# Patient Record
Sex: Female | Born: 1965 | Race: Black or African American | Hispanic: No | Marital: Married | Smoking: Never smoker
Health system: Southern US, Community
[De-identification: ages and names within clinical notes are randomized; demographics above are authoritative.]

## PROBLEM LIST (undated history)

## (undated) DIAGNOSIS — K219 Gastro-esophageal reflux disease without esophagitis: Secondary | ICD-10-CM

## (undated) DIAGNOSIS — H04123 Dry eye syndrome of bilateral lacrimal glands: Secondary | ICD-10-CM

## (undated) DIAGNOSIS — D649 Anemia, unspecified: Secondary | ICD-10-CM

## (undated) DIAGNOSIS — E785 Hyperlipidemia, unspecified: Secondary | ICD-10-CM

## (undated) DIAGNOSIS — M1990 Unspecified osteoarthritis, unspecified site: Secondary | ICD-10-CM

## (undated) DIAGNOSIS — D259 Leiomyoma of uterus, unspecified: Secondary | ICD-10-CM

## (undated) HISTORY — PX: UPPER GI ENDOSCOPY: SHX6162

## (undated) HISTORY — PX: COLONOSCOPY: SHX174

## (undated) HISTORY — DX: Dry eye syndrome of bilateral lacrimal glands: H04.123

## (undated) HISTORY — DX: Hyperlipidemia, unspecified: E78.5

## (undated) HISTORY — DX: Leiomyoma of uterus, unspecified: D25.9

## (undated) HISTORY — PX: OTHER SURGICAL HISTORY: SHX169

---

## 2000-02-15 ENCOUNTER — Other Ambulatory Visit: Admission: RE | Admit: 2000-02-15 | Discharge: 2000-02-15 | Payer: Self-pay | Admitting: Obstetrics

## 2000-04-07 ENCOUNTER — Ambulatory Visit (HOSPITAL_COMMUNITY): Admission: RE | Admit: 2000-04-07 | Discharge: 2000-04-07 | Payer: Self-pay | Admitting: Nephrology

## 2000-04-07 ENCOUNTER — Encounter: Payer: Self-pay | Admitting: Nephrology

## 2000-04-14 ENCOUNTER — Encounter: Payer: Self-pay | Admitting: Nephrology

## 2000-04-14 ENCOUNTER — Ambulatory Visit (HOSPITAL_COMMUNITY): Admission: RE | Admit: 2000-04-14 | Discharge: 2000-04-14 | Payer: Self-pay | Admitting: Nephrology

## 2000-07-07 ENCOUNTER — Encounter: Admission: RE | Admit: 2000-07-07 | Discharge: 2000-07-07 | Payer: Self-pay | Admitting: Nephrology

## 2000-07-07 ENCOUNTER — Encounter: Payer: Self-pay | Admitting: Nephrology

## 2002-02-22 ENCOUNTER — Emergency Department (HOSPITAL_COMMUNITY): Admission: EM | Admit: 2002-02-22 | Discharge: 2002-02-22 | Payer: Self-pay | Admitting: Emergency Medicine

## 2002-02-22 ENCOUNTER — Encounter: Payer: Self-pay | Admitting: Emergency Medicine

## 2002-06-14 ENCOUNTER — Encounter: Payer: Self-pay | Admitting: Obstetrics

## 2002-06-14 ENCOUNTER — Ambulatory Visit (HOSPITAL_COMMUNITY): Admission: RE | Admit: 2002-06-14 | Discharge: 2002-06-14 | Payer: Self-pay | Admitting: Obstetrics

## 2004-02-17 ENCOUNTER — Emergency Department (HOSPITAL_COMMUNITY): Admission: EM | Admit: 2004-02-17 | Discharge: 2004-02-18 | Payer: Self-pay | Admitting: Emergency Medicine

## 2004-09-03 ENCOUNTER — Ambulatory Visit (HOSPITAL_COMMUNITY): Admission: RE | Admit: 2004-09-03 | Discharge: 2004-09-03 | Payer: Self-pay | Admitting: Obstetrics

## 2006-02-02 ENCOUNTER — Ambulatory Visit (HOSPITAL_COMMUNITY): Admission: RE | Admit: 2006-02-02 | Discharge: 2006-02-02 | Payer: Self-pay | Admitting: Obstetrics

## 2007-06-19 ENCOUNTER — Ambulatory Visit (HOSPITAL_COMMUNITY): Admission: RE | Admit: 2007-06-19 | Discharge: 2007-06-19 | Payer: Self-pay | Admitting: Obstetrics

## 2009-03-18 ENCOUNTER — Ambulatory Visit (HOSPITAL_COMMUNITY): Admission: RE | Admit: 2009-03-18 | Discharge: 2009-03-18 | Payer: Self-pay | Admitting: Obstetrics

## 2009-04-28 ENCOUNTER — Encounter: Admission: RE | Admit: 2009-04-28 | Discharge: 2009-04-28 | Payer: Self-pay | Admitting: Obstetrics

## 2010-04-30 ENCOUNTER — Ambulatory Visit (HOSPITAL_COMMUNITY)
Admission: RE | Admit: 2010-04-30 | Discharge: 2010-04-30 | Payer: Self-pay | Source: Home / Self Care | Attending: Obstetrics | Admitting: Obstetrics

## 2010-05-24 ENCOUNTER — Encounter: Payer: Self-pay | Admitting: Obstetrics

## 2010-05-25 ENCOUNTER — Encounter: Payer: Self-pay | Admitting: Obstetrics

## 2011-04-01 ENCOUNTER — Other Ambulatory Visit: Payer: Self-pay | Admitting: Obstetrics

## 2011-04-01 DIAGNOSIS — Z1231 Encounter for screening mammogram for malignant neoplasm of breast: Secondary | ICD-10-CM

## 2011-05-03 ENCOUNTER — Ambulatory Visit
Admission: RE | Admit: 2011-05-03 | Discharge: 2011-05-03 | Disposition: A | Discharge status: Home / Self Care | Payer: 59 | Source: Ambulatory Visit | Attending: Obstetrics | Admitting: Obstetrics

## 2011-05-03 DIAGNOSIS — Z1231 Encounter for screening mammogram for malignant neoplasm of breast: Secondary | ICD-10-CM

## 2011-05-10 ENCOUNTER — Encounter: Payer: Self-pay | Admitting: *Deleted

## 2011-05-10 ENCOUNTER — Emergency Department (INDEPENDENT_AMBULATORY_CARE_PROVIDER_SITE_OTHER)
Admission: EM | Admit: 2011-05-10 | Discharge: 2011-05-10 | Disposition: A | Discharge status: Home / Self Care | Payer: 59 | Source: Home / Self Care

## 2011-05-10 DIAGNOSIS — J069 Acute upper respiratory infection, unspecified: Secondary | ICD-10-CM

## 2011-05-10 MED ORDER — CETIRIZINE-PSEUDOEPHEDRINE ER 5-120 MG PO TB12: 1.0000 | ORAL_TABLET | Freq: Two times a day (BID) | ORAL | Status: AC

## 2011-05-10 NOTE — ED Provider Notes (Signed)
History     CSN: [id number]  Arrival date & time 05/10/11  1109   None     Chief Complaint  Patient presents with  . Cough    (Consider location/radiation/quality/duration/timing/severity/associated sxs/prior treatment) HPI Comments: "My only symptom is nasal congestion and runny nose." Onset of symptoms yesterday. Rhinorrhea is clear. Denies fever, cough or sore throat. Took Claritin this morning and Nyquil last night without improvement.    Past Medical History  Diagnosis Date  . Asthma     Past Surgical History  Procedure Date  . Btl     History reviewed. No pertinent family history.  History  Substance Use Topics  . Smoking status: Never Smoker   . Smokeless tobacco: Not on file  . Alcohol Use: No    OB History    Grav Para Term Preterm Abortions TAB SAB Ect Mult Living                  Review of Systems  Constitutional: Negative for fever, chills and fatigue.  HENT: Positive for congestion and rhinorrhea. Negative for ear pain, sore throat, sneezing, postnasal drip and sinus pressure.   Respiratory: Negative for cough, shortness of breath and wheezing.   Cardiovascular: Negative for chest pain and palpitations.    Allergies  Review of patient's allergies indicates not on file.  Home Medications   Current Outpatient Rx  Name Route Sig Dispense Refill  . CETIRIZINE-PSEUDOEPHEDRINE ER 5-120 MG PO TB12 Oral Take 1 tablet by mouth 2 (two) times daily. 20 tablet 0    BP 126/63  Pulse 70  Temp(Src) 98.4 F (36.9 C) (Oral)  Resp 20  SpO2 100%  Physical Exam  Nursing note and vitals reviewed. Constitutional: She appears well-developed and well-nourished. No distress.  HENT:  Head: Normocephalic and atraumatic.  Right Ear: Tympanic membrane, external ear and ear canal normal.  Left Ear: Tympanic membrane, external ear and ear canal normal.  Nose: Mucosal edema (severe) present.  Mouth/Throat: Uvula is midline, oropharynx is clear and moist and  mucous membranes are normal. No oropharyngeal exudate, posterior oropharyngeal edema or posterior oropharyngeal erythema.  Neck: Neck supple.  Cardiovascular: Normal rate, regular rhythm and normal heart sounds.   Pulmonary/Chest: Effort normal and breath sounds normal. No respiratory distress.  Lymphadenopathy:    She has no cervical adenopathy.  Neurological: She is alert.  Skin: Skin is warm and dry.  Psychiatric: She has a normal mood and affect.    ED Course  Procedures (including critical care time)  Labs Reviewed - No data to display No results found.   1. Acute URI       MDM          Melody Comas, PA 05/10/11 1503

## 2011-05-10 NOTE — ED Notes (Signed)
Pt  Reports  Symptoms  Of  Nasal  Congestion   Stuffy  Nose   As  Well  As      Body  Aches   -  Symptoms    X  1  Day  -  The  Pt  Reports      That     She  Has  Tried  otc  nyquil  Without  releif

## 2011-05-10 NOTE — ED Provider Notes (Signed)
Medical screening examination/treatment/procedure(s) were performed by non-physician practitioner and as supervising physician I was immediately available for consultation/collaboration.  Luiz Blare MD   Luiz Blare, MD 05/10/11 [phone]

## 2011-07-19 ENCOUNTER — Other Ambulatory Visit (HOSPITAL_COMMUNITY): Payer: Self-pay | Admitting: Obstetrics

## 2011-07-19 DIAGNOSIS — D219 Benign neoplasm of connective and other soft tissue, unspecified: Secondary | ICD-10-CM

## 2011-07-21 ENCOUNTER — Ambulatory Visit (HOSPITAL_COMMUNITY): Payer: 59

## 2011-07-23 ENCOUNTER — Ambulatory Visit (HOSPITAL_COMMUNITY)
Admission: RE | Admit: 2011-07-23 | Discharge: 2011-07-23 | Disposition: A | Discharge status: Home / Self Care | Payer: 59 | Source: Ambulatory Visit | Attending: Obstetrics | Admitting: Obstetrics

## 2011-07-23 DIAGNOSIS — D259 Leiomyoma of uterus, unspecified: Secondary | ICD-10-CM | POA: Insufficient documentation

## 2011-07-23 DIAGNOSIS — N926 Irregular menstruation, unspecified: Secondary | ICD-10-CM | POA: Insufficient documentation

## 2011-07-23 DIAGNOSIS — D219 Benign neoplasm of connective and other soft tissue, unspecified: Secondary | ICD-10-CM

## 2012-07-10 ENCOUNTER — Other Ambulatory Visit: Payer: Self-pay

## 2012-07-10 DIAGNOSIS — Z1231 Encounter for screening mammogram for malignant neoplasm of breast: Secondary | ICD-10-CM

## 2012-07-17 ENCOUNTER — Ambulatory Visit
Admission: RE | Admit: 2012-07-17 | Discharge: 2012-07-17 | Disposition: A | Discharge status: Home / Self Care | Payer: 59 | Source: Ambulatory Visit

## 2012-07-17 ENCOUNTER — Ambulatory Visit: Payer: 59

## 2012-07-17 DIAGNOSIS — Z1231 Encounter for screening mammogram for malignant neoplasm of breast: Secondary | ICD-10-CM

## 2013-07-24 ENCOUNTER — Other Ambulatory Visit: Payer: Self-pay

## 2013-07-24 DIAGNOSIS — Z1231 Encounter for screening mammogram for malignant neoplasm of breast: Secondary | ICD-10-CM

## 2013-08-16 ENCOUNTER — Ambulatory Visit
Admission: RE | Admit: 2013-08-16 | Discharge: 2013-08-16 | Disposition: A | Discharge status: Home / Self Care | Payer: 59 | Source: Ambulatory Visit

## 2013-08-16 DIAGNOSIS — Z1231 Encounter for screening mammogram for malignant neoplasm of breast: Secondary | ICD-10-CM

## 2014-06-13 ENCOUNTER — Other Ambulatory Visit: Payer: Self-pay

## 2014-06-13 DIAGNOSIS — Z1231 Encounter for screening mammogram for malignant neoplasm of breast: Secondary | ICD-10-CM

## 2014-08-19 ENCOUNTER — Ambulatory Visit
Admission: RE | Admit: 2014-08-19 | Discharge: 2014-08-19 | Disposition: A | Discharge status: Home / Self Care | Payer: 59 | Source: Ambulatory Visit

## 2014-08-19 DIAGNOSIS — Z1231 Encounter for screening mammogram for malignant neoplasm of breast: Secondary | ICD-10-CM

## 2015-08-08 ENCOUNTER — Other Ambulatory Visit: Payer: Self-pay

## 2015-08-08 DIAGNOSIS — Z1231 Encounter for screening mammogram for malignant neoplasm of breast: Secondary | ICD-10-CM

## 2015-09-01 ENCOUNTER — Ambulatory Visit: Payer: 59

## 2015-09-03 ENCOUNTER — Ambulatory Visit
Admission: RE | Admit: 2015-09-03 | Discharge: 2015-09-03 | Disposition: A | Discharge status: Home / Self Care | Payer: 59 | Source: Ambulatory Visit

## 2015-09-03 DIAGNOSIS — Z1231 Encounter for screening mammogram for malignant neoplasm of breast: Secondary | ICD-10-CM

## 2015-10-28 ENCOUNTER — Ambulatory Visit
Admission: RE | Admit: 2015-10-28 | Discharge: 2015-10-28 | Disposition: A | Discharge status: Home / Self Care | Payer: 59 | Source: Ambulatory Visit | Attending: Cardiology | Admitting: Cardiology

## 2015-10-28 ENCOUNTER — Other Ambulatory Visit: Payer: Self-pay | Admitting: Cardiology

## 2015-10-28 DIAGNOSIS — M25562 Pain in left knee: Secondary | ICD-10-CM

## 2016-01-30 LAB — PROCEDURE REPORT - SCANNED: Pap: NEGATIVE

## 2016-03-17 ENCOUNTER — Other Ambulatory Visit: Payer: Self-pay | Admitting: Gastroenterology

## 2016-03-17 DIAGNOSIS — R102 Pelvic and perineal pain: Secondary | ICD-10-CM

## 2016-03-30 ENCOUNTER — Ambulatory Visit
Admission: RE | Admit: 2016-03-30 | Discharge: 2016-03-30 | Disposition: A | Discharge status: Home / Self Care | Payer: 59 | Source: Ambulatory Visit | Attending: Gastroenterology | Admitting: Gastroenterology

## 2016-03-30 DIAGNOSIS — R102 Pelvic and perineal pain: Secondary | ICD-10-CM

## 2016-04-12 ENCOUNTER — Ambulatory Visit
Admission: RE | Admit: 2016-04-12 | Discharge: 2016-04-12 | Disposition: A | Discharge status: Home / Self Care | Payer: 59 | Source: Ambulatory Visit | Attending: Cardiology | Admitting: Cardiology

## 2016-04-12 ENCOUNTER — Other Ambulatory Visit: Payer: Self-pay | Admitting: Cardiology

## 2016-04-12 DIAGNOSIS — R42 Dizziness and giddiness: Secondary | ICD-10-CM

## 2016-04-15 ENCOUNTER — Encounter (HOSPITAL_COMMUNITY): Payer: Self-pay

## 2016-04-15 ENCOUNTER — Other Ambulatory Visit: Payer: Self-pay | Admitting: Obstetrics and Gynecology

## 2016-04-29 ENCOUNTER — Ambulatory Visit (HOSPITAL_COMMUNITY): Payer: 59 | Admitting: Anesthesiology

## 2016-04-29 ENCOUNTER — Ambulatory Visit (HOSPITAL_COMMUNITY)
Admission: RE | Admit: 2016-04-29 | Discharge: 2016-04-29 | Disposition: A | Discharge status: Home / Self Care | Payer: 59 | Source: Ambulatory Visit | Attending: Obstetrics and Gynecology | Admitting: Obstetrics and Gynecology

## 2016-04-29 ENCOUNTER — Encounter (HOSPITAL_COMMUNITY): Payer: Self-pay | Admitting: Emergency Medicine

## 2016-04-29 ENCOUNTER — Encounter (HOSPITAL_COMMUNITY)
Admission: RE | Disposition: A | Discharge status: Home / Self Care | Payer: Self-pay | Source: Ambulatory Visit | Attending: Obstetrics and Gynecology

## 2016-04-29 DIAGNOSIS — N840 Polyp of corpus uteri: Secondary | ICD-10-CM | POA: Insufficient documentation

## 2016-04-29 DIAGNOSIS — R9389 Abnormal findings on diagnostic imaging of other specified body structures: Secondary | ICD-10-CM

## 2016-04-29 DIAGNOSIS — N9489 Other specified conditions associated with female genital organs and menstrual cycle: Secondary | ICD-10-CM

## 2016-04-29 HISTORY — DX: Unspecified osteoarthritis, unspecified site: M19.90

## 2016-04-29 HISTORY — PX: DILATATION & CURETTAGE/HYSTEROSCOPY WITH MYOSURE: SHX6511

## 2016-04-29 LAB — CBC
HEMATOCRIT: 41.2 % (ref 36.0–46.0)
Hemoglobin: 13.6 g/dL (ref 12.0–15.0)
MCH: 28.8 pg (ref 26.0–34.0)
MCHC: 33 g/dL (ref 30.0–36.0)
MCV: 87.1 fL (ref 78.0–100.0)
Platelets: 277 10*3/uL (ref 150–400)
RBC: 4.73 MIL/uL (ref 3.87–5.11)
RDW: 14.7 % (ref 11.5–15.5)
WBC: 5.9 10*3/uL (ref 4.0–10.5)

## 2016-04-29 SURGERY — DILATATION & CURETTAGE/HYSTEROSCOPY WITH MYOSURE
Anesthesia: General | Site: Vagina

## 2016-04-29 MED ORDER — KETOROLAC TROMETHAMINE 30 MG/ML IJ SOLN
INTRAMUSCULAR | Status: AC
  Filled 2016-04-29: qty 1

## 2016-04-29 MED ORDER — FENTANYL CITRATE (PF) 100 MCG/2ML IJ SOLN
INTRAMUSCULAR | Status: DC | PRN
  Administered 2016-04-29 (×2): 50 ug via INTRAVENOUS

## 2016-04-29 MED ORDER — MIDAZOLAM HCL 2 MG/2ML IJ SOLN
INTRAMUSCULAR | Status: DC | PRN
  Administered 2016-04-29: 1 mg via INTRAVENOUS

## 2016-04-29 MED ORDER — ALBUTEROL SULFATE (2.5 MG/3ML) 0.083% IN NEBU
INHALATION_SOLUTION | RESPIRATORY_TRACT | Status: AC
  Filled 2016-04-29: qty 3

## 2016-04-29 MED ORDER — ONDANSETRON HCL 4 MG/2ML IJ SOLN
INTRAMUSCULAR | Status: DC | PRN
  Administered 2016-04-29: 4 mg via INTRAVENOUS

## 2016-04-29 MED ORDER — HYDROMORPHONE HCL 1 MG/ML IJ SOLN: 0.2500 mg | INTRAMUSCULAR | Status: DC | PRN

## 2016-04-29 MED ORDER — DEXAMETHASONE SODIUM PHOSPHATE 4 MG/ML IJ SOLN
INTRAMUSCULAR | Status: AC
  Filled 2016-04-29: qty 1

## 2016-04-29 MED ORDER — SCOPOLAMINE 1 MG/3DAYS TD PT72
MEDICATED_PATCH | TRANSDERMAL | Status: AC
  Administered 2016-04-29: 1.5 mg via TRANSDERMAL
  Filled 2016-04-29: qty 1

## 2016-04-29 MED ORDER — FENTANYL CITRATE (PF) 100 MCG/2ML IJ SOLN
INTRAMUSCULAR | Status: AC
  Filled 2016-04-29: qty 2

## 2016-04-29 MED ORDER — PROPOFOL 10 MG/ML IV BOLUS
INTRAVENOUS | Status: AC
  Filled 2016-04-29: qty 20

## 2016-04-29 MED ORDER — LIDOCAINE HCL (CARDIAC) 20 MG/ML IV SOLN
INTRAVENOUS | Status: DC | PRN
  Administered 2016-04-29: 30 mg via INTRAVENOUS

## 2016-04-29 MED ORDER — DEXAMETHASONE SODIUM PHOSPHATE 10 MG/ML IJ SOLN
INTRAMUSCULAR | Status: DC | PRN
  Administered 2016-04-29: 4 mg via INTRAVENOUS

## 2016-04-29 MED ORDER — LIDOCAINE HCL (CARDIAC) 20 MG/ML IV SOLN
INTRAVENOUS | Status: AC
  Filled 2016-04-29: qty 5

## 2016-04-29 MED ORDER — PROMETHAZINE HCL 25 MG/ML IJ SOLN: 6.2500 mg | INTRAMUSCULAR | Status: DC | PRN

## 2016-04-29 MED ORDER — KETOROLAC TROMETHAMINE 30 MG/ML IJ SOLN
INTRAMUSCULAR | Status: DC | PRN
  Administered 2016-04-29: 30 mg via INTRAVENOUS

## 2016-04-29 MED ORDER — SCOPOLAMINE 1 MG/3DAYS TD PT72
1.0000 | MEDICATED_PATCH | Freq: Once | TRANSDERMAL | Status: DC
  Administered 2016-04-29: 1.5 mg via TRANSDERMAL

## 2016-04-29 MED ORDER — ONDANSETRON HCL 4 MG/2ML IJ SOLN
INTRAMUSCULAR | Status: AC
  Filled 2016-04-29: qty 2

## 2016-04-29 MED ORDER — MIDAZOLAM HCL 2 MG/2ML IJ SOLN
INTRAMUSCULAR | Status: AC
  Filled 2016-04-29: qty 2

## 2016-04-29 MED ORDER — PROPOFOL 10 MG/ML IV BOLUS
INTRAVENOUS | Status: DC | PRN
  Administered 2016-04-29: 200 mg via INTRAVENOUS
  Administered 2016-04-29: 180 mg via INTRAVENOUS
  Administered 2016-04-29: 2 mg via INTRAVENOUS

## 2016-04-29 MED ORDER — LACTATED RINGERS IV SOLN
INTRAVENOUS | Status: DC
  Administered 2016-04-29: 08:00:00 via INTRAVENOUS

## 2016-04-29 MED ORDER — ALBUTEROL SULFATE (2.5 MG/3ML) 0.083% IN NEBU
2.5000 mg | INHALATION_SOLUTION | Freq: Once | RESPIRATORY_TRACT | Status: AC
  Administered 2016-04-29: 2.5 mg via RESPIRATORY_TRACT

## 2016-04-29 MED ORDER — IBUPROFEN 800 MG PO TABS: 800.0000 mg | ORAL_TABLET | Freq: Three times a day (TID) | ORAL | 0 refills | Status: DC

## 2016-04-29 SURGICAL SUPPLY — 20 items
CANISTER SUCT 3000ML (MISCELLANEOUS) ×2 IMPLANT
CATH ROBINSON RED A/P 16FR (CATHETERS) ×2 IMPLANT
CLOTH BEACON ORANGE TIMEOUT ST (SAFETY) ×2 IMPLANT
CONTAINER PREFILL 10% NBF 60ML (FORM) ×4 IMPLANT
DEVICE MYOSURE LITE (MISCELLANEOUS) IMPLANT
DEVICE MYOSURE REACH (MISCELLANEOUS) ×2 IMPLANT
ELECT REM PT RETURN 9FT ADLT (ELECTROSURGICAL) ×2
ELECTRODE REM PT RTRN 9FT ADLT (ELECTROSURGICAL) ×1 IMPLANT
FILTER ARTHROSCOPY CONVERTOR (FILTER) ×2 IMPLANT
GLOVE BIOGEL PI IND STRL 7.0 (GLOVE) ×2 IMPLANT
GLOVE BIOGEL PI INDICATOR 7.0 (GLOVE) ×2
GLOVE ECLIPSE 6.5 STRL STRAW (GLOVE) ×2 IMPLANT
GOWN STRL REUS W/TWL LRG LVL3 (GOWN DISPOSABLE) ×4 IMPLANT
PACK VAGINAL MINOR WOMEN LF (CUSTOM PROCEDURE TRAY) ×2 IMPLANT
PAD OB MATERNITY 4.3X12.25 (PERSONAL CARE ITEMS) ×2 IMPLANT
SEAL ROD LENS SCOPE MYOSURE (ABLATOR) ×2 IMPLANT
TOWEL OR 17X24 6PK STRL BLUE (TOWEL DISPOSABLE) ×4 IMPLANT
TUBING AQUILEX INFLOW (TUBING) ×2 IMPLANT
TUBING AQUILEX OUTFLOW (TUBING) ×2 IMPLANT
WATER STERILE IRR 1000ML POUR (IV SOLUTION) ×2 IMPLANT

## 2016-04-29 NOTE — Anesthesia Procedure Notes (Signed)
Procedure Name: LMA Insertion Date/Time: 04/29/2016 8:32 AM Performed by: Tobin Chad Pre-anesthesia Checklist: Patient identified, Emergency Drugs available, Suction available and Patient being monitored Patient Re-evaluated:Patient Re-evaluated prior to inductionOxygen Delivery Method: Circle system utilized and Simple face mask Preoxygenation: Pre-oxygenation with 100% oxygen Intubation Type: IV induction and Inhalational induction with existing ETT Ventilation: Mask ventilation without difficulty LMA: LMA with gastric port inserted LMA Size: 4.0 Number of attempts: 2

## 2016-04-29 NOTE — Discharge Instructions (Signed)
CALL  IF TEMP>100.4, NOTHING PER VAGINA X 1 WK, CALL IF SOAKING A MAXI  PAD EVERY HOUR OR MORE FREQUENTLY  DISCHARGE INSTRUCTIONS: HYSTEROSCOPY / ENDOMETRIAL ABLATION The following instructions have been prepared to help you care for yourself upon your return home.  May Remove Scop patch on or before Sunday 05/02/16. Wash your hands with soap and water after contact with the patch.  May take Ibuprofen after 2:30pm 04/29/16.  May take stool softner while taking narcotic pain medication to prevent constipation.  Drink plenty of water.  Personal hygiene:  Use sanitary pads for vaginal drainage, not tampons.  Shower the day after your procedure.  NO tub baths, pools or Jacuzzis for 2-3 weeks.  Wipe front to back after using the bathroom.  Activity and limitations:  Do NOT drive or operate any equipment for 24 hours. The effects of anesthesia are still present and drowsiness may result.  Do NOT rest in bed all day.  Walking is encouraged.  Walk up and down stairs slowly.  You may resume your normal activity in one to two days or as indicated by your physician. Sexual activity: NO intercourse for at least 2 weeks after the procedure, or as indicated by your Doctor.  Diet: Eat a light meal as desired this evening. You may resume your usual diet tomorrow.  Return to Work: You may resume your work activities in one to two days or as indicated by Marine scientist.  What to expect after your surgery: Expect to have vaginal bleeding/discharge for 2-3 days and spotting for up to 10 days. It is not unusual to have soreness for up to 1-2 weeks. You may have a slight burning sensation when you urinate for the first day. Mild cramps may continue for a couple of days. You may have a regular period in 2-6 weeks.  Call your doctor for any of the following:  Excessive vaginal bleeding or clotting, saturating and changing one pad every hour.  Inability to urinate 6 hours after discharge from  hospital.  Pain not relieved by pain medication.  Fever of 100.4 F or greater.  Unusual vaginal discharge or odor.  Return to office _________________Call for an appointment ___________________ Patients signature: ______________________ Nurses signature ________________________  [facility] [phone]

## 2016-04-29 NOTE — Addendum Note (Signed)
Addendum  created 04/29/16 1010 by Tobin Chad, CRNA   Anesthesia Event edited, Anesthesia Staff edited

## 2016-04-29 NOTE — Transfer of Care (Signed)
Immediate Anesthesia Transfer of Care Note  Patient: Kristin [name]  Procedure(s) Performed: Procedure(s): DILATATION & CURETTAGE/HYSTEROSCOPY WITH MYOSURE (N/A)  Patient Location: PACU  Anesthesia Type:General  Level of Consciousness: awake, alert , oriented and patient cooperative  Airway & Oxygen Therapy: Patient Spontanous Breathing and Patient connected to nasal cannula oxygen  Post-op Assessment: Report given to RN and Post -op Vital signs reviewed and stable  Post vital signs: Reviewed and stable  Last Vitals:  Vitals:   04/29/16 0732  BP: (!) 160/86  Pulse: 99  Resp: 16  Temp: 37.1 C    Last Pain:  Vitals:   04/29/16 0732  TempSrc: Oral      Patients Stated Pain Goal: 4 ([id number])  Complications: No apparent anesthesia complications

## 2016-04-29 NOTE — Brief Op Note (Signed)
04/29/2016  9:14 AM  PATIENT:  Elonda Husky  50 y.o. female  PRE-OPERATIVE DIAGNOSIS:  Endometrial Mass, endometrial thickening on sonogram  POST-OPERATIVE DIAGNOSIS:  endometrial mass, endometrial thickening on sonogram  PROCEDURE:  Diagnostic hysteroscopy, hysteroscopic resection of endometrial polyp using  Myosure, dilation and curettage  SURGEON:  Surgeon(s) and Role:    * Servando Salina, MD - Primary  PHYSICIAN ASSISTANT:   ASSISTANTS: none   ANESTHESIA:   general FINDINGS: large posterior endometrial polyp, tubal ostia seen,atrophic pale endometrium  EBL:  Total I/O In: 800 [I.V.:800] Out: 50 [Urine:40; Blood:10]  BLOOD ADMINISTERED:none  DRAINS: none   LOCAL MEDICATIONS USED:  NONE  SPECIMEN:  Endometrial polyp with EMC   DISPOSITION OF SPECIMEN:  PATHOLOGY  COUNTS:  YES  TOURNIQUET:  * No tourniquets in log *  DICTATION: .Other Dictation: Dictation Number [id number]  PLAN OF CARE: Discharge to home after PACU  PATIENT DISPOSITION:  PACU - hemodynamically stable.   Delay start of Pharmacological VTE agent (>24hrs) due to surgical blood loss or risk of bleeding: no

## 2016-04-29 NOTE — Anesthesia Postprocedure Evaluation (Signed)
Anesthesia Post Note  Patient: Kristin [name]  Procedure(s) Performed: Procedure(s) (LRB): DILATATION & CURETTAGE/HYSTEROSCOPY WITH MYOSURE (N/A)  Patient location during evaluation: PACU Anesthesia Type: General Level of consciousness: sedated Pain management: pain level controlled Vital Signs Assessment: post-procedure vital signs reviewed and stable Respiratory status: spontaneous breathing and respiratory function stable Cardiovascular status: stable Anesthetic complications: no        Last Vitals:  Vitals:   04/29/16 0930 04/29/16 0945  BP: 108/64 116/68  Pulse: 60 (!) 58  Resp: 14 15  Temp:      Last Pain:  Vitals:   04/29/16 0732  TempSrc: Oral   Pain Goal: Patients Stated Pain Goal: 4 (04/29/16 0906)               Duane Boston DANIEL

## 2016-04-29 NOTE — Anesthesia Preprocedure Evaluation (Addendum)
Anesthesia Evaluation  Patient identified by MRN, date of birth, ID band Patient awake    Reviewed: Allergy & Precautions, NPO status , Patient's Chart, lab work & pertinent test results  Airway Mallampati: II  TM Distance: >3 FB     Dental no notable dental hx. 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   Endo/Other  negative endocrine ROS  Renal/GU negative Renal ROS  negative genitourinary   Musculoskeletal negative musculoskeletal ROS (+)   Abdominal   Peds negative pediatric ROS (+)  Hematology negative hematology ROS (+)   Anesthesia Other Findings   Reproductive/Obstetrics negative OB ROS                            Anesthesia Physical Anesthesia Plan  ASA: III  Anesthesia Plan: General   Post-op Pain Management:    Induction: Intravenous  Airway Management Planned: LMA  Additional Equipment:   Intra-op Plan:   Post-operative Plan: Extubation in OR  Informed Consent: I have reviewed the patients History and Physical, chart, labs and discussed the procedure including the risks, benefits and alternatives for the proposed anesthesia with the patient or authorized representative who has indicated his/her understanding and acceptance.   Dental advisory given  Plan Discussed with: CRNA, Anesthesiologist and Surgeon  Anesthesia Plan Comments: (Pt denies any symptoms of carcinoid syndrome.)       Anesthesia Quick Evaluation

## 2016-04-30 ENCOUNTER — Encounter (HOSPITAL_COMMUNITY): Payer: Self-pay | Admitting: Obstetrics and Gynecology

## 2016-04-30 NOTE — Addendum Note (Signed)
Addendum  created 04/30/16 0957 by Laverle Hobby, CRNA   Charge Capture section accepted

## 2016-04-30 NOTE — Op Note (Signed)
NAMEKIERNAN, DOTTERY NO.:  [id number]  MEDICAL RECORD NO.:  [id number]  LOCATION:                                 FACILITY:  PHYSICIAN:  Servando Salina, M.D.DATE OF BIRTH:  October 11, 1965  DATE OF PROCEDURE:  04/29/2016 DATE OF DISCHARGE:  04/29/2016                              OPERATIVE REPORT   PREOPERATIVE DIAGNOSES:  Endometrial mass, endometrial thickening on sonogram.  PROCEDURES:  Diagnostic hysteroscopy, hysteroscopic resection of endometrial polyp using the MyoSure, dilation and curettage.  POSTOPERATIVE DIAGNOSES:  Endometrial polyp, endometrial thickening on sonogram.  ANESTHESIA:  General.  SURGEON:  Servando Salina, MD.  ASSISTANT:  None.  DESCRIPTION OF PROCEDURE:  Under adequate general anesthesia, the patient was placed in the dorsal lithotomy position.  She was sterilely prepped and draped in usual fashion.  Bladder was catheterized about 20 mL of urine.  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  No adnexal masses could be appreciated. Bivalve speculum was placed in the vagina.  Atrophic vaginal wall was noted.  The anterior lip of the cervix was grasped with a single-tooth tenaculum.  The cervix was then serially dilated up to #23 [facility] dilator.  After much manipulation, the hysteroscope was inserted into the uterine cavity, and of note, was a large polypoid mass, which ultimately was noted to be arising off the posterior wall.  The right tubal ostia could be seen.  The left was not seen.  The Reach MyoSure resectoscope was then inserted.  The mass was resected in its entirety. There was a smaller mass noted at the junction of the endocervical canal, which was removed.  The remaining cavity was atrophic and pale. Both tubal ostia were then seen.  The instruments were then removed. The cavity was then gently curetted for scant amount of tissue.  All instruments were then removed from the  vagina.  SPECIMEN LABELED:  Endometrial curetting with polyp, and sent to Pathology.  ESTIMATED BLOOD LOSS:  10 mL.  INTRAOPERATIVE FLUID:  800 mL.  URINE OUTPUT:  40 mL.  COUNTS:  Sponge and instrument count x2 was correct.  COMPLICATION:  None.  The patient tolerated the procedure well, was transferred to recovery in stable condition.     Servando Salina, M.D.   ______________________________ Servando Salina, M.D.    [place]/MEDQ  D:  04/29/2016  T:  04/29/2016  Job:  [id number]

## 2016-06-09 ENCOUNTER — Other Ambulatory Visit: Payer: Self-pay | Admitting: Cardiology

## 2016-06-09 DIAGNOSIS — D3A00 Benign carcinoid tumor of unspecified site: Secondary | ICD-10-CM

## 2016-06-09 DIAGNOSIS — R109 Unspecified abdominal pain: Secondary | ICD-10-CM

## 2016-06-09 DIAGNOSIS — R102 Pelvic and perineal pain: Secondary | ICD-10-CM

## 2016-06-16 ENCOUNTER — Ambulatory Visit
Admission: RE | Admit: 2016-06-16 | Discharge: 2016-06-16 | Disposition: A | Discharge status: Home / Self Care | Payer: 59 | Source: Ambulatory Visit | Attending: Cardiology | Admitting: Cardiology

## 2016-06-16 DIAGNOSIS — R102 Pelvic and perineal pain: Secondary | ICD-10-CM

## 2016-06-16 DIAGNOSIS — D3A00 Benign carcinoid tumor of unspecified site: Secondary | ICD-10-CM

## 2016-06-16 DIAGNOSIS — R109 Unspecified abdominal pain: Secondary | ICD-10-CM

## 2016-06-16 MED ORDER — IOPAMIDOL (ISOVUE-300) INJECTION 61%
100.0000 mL | Freq: Once | INTRAVENOUS | Status: AC | PRN
  Administered 2016-06-16: 100 mL via INTRAVENOUS

## 2016-08-12 ENCOUNTER — Other Ambulatory Visit: Payer: Self-pay | Admitting: Obstetrics and Gynecology

## 2016-08-12 DIAGNOSIS — Z1231 Encounter for screening mammogram for malignant neoplasm of breast: Secondary | ICD-10-CM

## 2016-09-03 ENCOUNTER — Ambulatory Visit
Admission: RE | Admit: 2016-09-03 | Discharge: 2016-09-03 | Disposition: A | Discharge status: Home / Self Care | Payer: 59 | Source: Ambulatory Visit | Attending: Obstetrics and Gynecology | Admitting: Obstetrics and Gynecology

## 2016-09-03 DIAGNOSIS — Z1231 Encounter for screening mammogram for malignant neoplasm of breast: Secondary | ICD-10-CM

## 2017-08-29 ENCOUNTER — Other Ambulatory Visit: Payer: Self-pay | Admitting: Obstetrics & Gynecology

## 2017-08-29 DIAGNOSIS — Z1231 Encounter for screening mammogram for malignant neoplasm of breast: Secondary | ICD-10-CM

## 2017-09-20 ENCOUNTER — Ambulatory Visit: Payer: 59

## 2017-10-28 ENCOUNTER — Ambulatory Visit
Admission: RE | Admit: 2017-10-28 | Discharge: 2017-10-28 | Disposition: A | Discharge status: Home / Self Care | Payer: 59 | Source: Ambulatory Visit | Attending: Obstetrics & Gynecology | Admitting: Obstetrics & Gynecology

## 2017-10-28 DIAGNOSIS — Z1231 Encounter for screening mammogram for malignant neoplasm of breast: Secondary | ICD-10-CM

## 2018-11-17 ENCOUNTER — Other Ambulatory Visit: Payer: Self-pay | Admitting: Obstetrics & Gynecology

## 2018-11-17 DIAGNOSIS — Z1231 Encounter for screening mammogram for malignant neoplasm of breast: Secondary | ICD-10-CM

## 2019-01-17 ENCOUNTER — Ambulatory Visit
Admission: RE | Admit: 2019-01-17 | Discharge: 2019-01-17 | Disposition: A | Discharge status: Home / Self Care | Payer: 59 | Source: Ambulatory Visit | Attending: Obstetrics & Gynecology | Admitting: Obstetrics & Gynecology

## 2019-01-17 ENCOUNTER — Other Ambulatory Visit: Payer: Self-pay

## 2019-01-17 DIAGNOSIS — Z1231 Encounter for screening mammogram for malignant neoplasm of breast: Secondary | ICD-10-CM

## 2019-05-29 DIAGNOSIS — M1990 Unspecified osteoarthritis, unspecified site: Secondary | ICD-10-CM | POA: Insufficient documentation

## 2019-07-27 ENCOUNTER — Ambulatory Visit: Payer: Self-pay | Attending: Internal Medicine

## 2019-07-27 DIAGNOSIS — Z23 Encounter for immunization: Secondary | ICD-10-CM

## 2019-07-27 NOTE — Progress Notes (Signed)
   Covid-19 Vaccination Clinic  Name:  Kristin [name]    MRN: [id number] DOB: [birth year]  07/27/2019  [name] was observed post Covid-19 immunization for 15 minutes without incident. She was provided with Vaccine Information Sheet and instruction to access the V-Safe system.   [name] was instructed to call 911 with any severe reactions post vaccine: Marland Kitchen Difficulty breathing  . Swelling of face and throat  . A fast heartbeat  . A bad rash all over body  . Dizziness and weakness   Immunizations Administered    Name Date Dose VIS Date Route   Pfizer COVID-19 Vaccine 07/27/2019 12:46 PM 0.3 mL 04/13/2019 Intramuscular   Manufacturer: [place]   Lot: [id number]   [place]: [id number]

## 2019-08-21 ENCOUNTER — Ambulatory Visit: Payer: Self-pay

## 2019-10-11 ENCOUNTER — Ambulatory Visit: Payer: Self-pay | Attending: Internal Medicine

## 2019-10-11 DIAGNOSIS — Z23 Encounter for immunization: Secondary | ICD-10-CM

## 2019-10-11 NOTE — Progress Notes (Signed)
   Covid-19 Vaccination Clinic  Name:  Kristin [name]    MRN: [id number] DOB: 1965-05-22  10/11/2019  [name] was observed post Covid-19 immunization for 15 minutes without incident. She was provided with Vaccine Information Sheet and instruction to access the V-Safe system.   [name] was instructed to call 911 with any severe reactions post vaccine: Marland Kitchen Difficulty breathing  . Swelling of face and throat  . A fast heartbeat  . A bad rash all over body  . Dizziness and weakness   Immunizations Administered    Name Date Dose VIS Date Route   Pfizer COVID-19 Vaccine 10/11/2019  2:31 PM 0.3 mL 06/27/2018 Intramuscular   Manufacturer: [employer]   Lot: [id number]   [place]: [id number]

## 2019-12-31 ENCOUNTER — Other Ambulatory Visit: Payer: Self-pay | Admitting: Obstetrics & Gynecology

## 2020-01-08 ENCOUNTER — Other Ambulatory Visit: Payer: Self-pay | Admitting: Obstetrics & Gynecology

## 2020-01-08 DIAGNOSIS — N6325 Unspecified lump in the left breast, overlapping quadrants: Secondary | ICD-10-CM

## 2020-01-21 ENCOUNTER — Other Ambulatory Visit: Payer: Self-pay

## 2020-01-24 ENCOUNTER — Ambulatory Visit
Admission: RE | Admit: 2020-01-24 | Discharge: 2020-01-24 | Disposition: A | Discharge status: Home / Self Care | Payer: 59 | Source: Ambulatory Visit | Attending: Obstetrics & Gynecology | Admitting: Obstetrics & Gynecology

## 2020-01-24 ENCOUNTER — Other Ambulatory Visit: Payer: Self-pay

## 2020-01-24 DIAGNOSIS — N6325 Unspecified lump in the left breast, overlapping quadrants: Secondary | ICD-10-CM

## 2020-12-23 ENCOUNTER — Other Ambulatory Visit: Payer: Self-pay | Admitting: Obstetrics & Gynecology

## 2020-12-23 DIAGNOSIS — Z1231 Encounter for screening mammogram for malignant neoplasm of breast: Secondary | ICD-10-CM

## 2021-01-28 ENCOUNTER — Other Ambulatory Visit: Payer: Self-pay

## 2021-01-28 ENCOUNTER — Ambulatory Visit
Admission: RE | Admit: 2021-01-28 | Discharge: 2021-01-28 | Disposition: A | Discharge status: Home / Self Care | Payer: 59 | Source: Ambulatory Visit | Attending: Obstetrics & Gynecology | Admitting: Obstetrics & Gynecology

## 2021-01-28 DIAGNOSIS — Z1231 Encounter for screening mammogram for malignant neoplasm of breast: Secondary | ICD-10-CM

## 2021-07-10 DIAGNOSIS — E782 Mixed hyperlipidemia: Secondary | ICD-10-CM | POA: Insufficient documentation

## 2021-09-14 ENCOUNTER — Ambulatory Visit (LOCAL_COMMUNITY_HEALTH_CENTER): Payer: Self-pay

## 2021-09-14 DIAGNOSIS — Z0184 Encounter for antibody response examination: Secondary | ICD-10-CM

## 2021-09-14 NOTE — Progress Notes (Signed)
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  ROI signed by pt to receive phone call with results.   ?Walked pt to lab.  Pt also referred to clerk to sign up for my chart to access results. ?Tonny Branch, RN  ?

## 2021-09-15 LAB — MEASLES/MUMPS/RUBELLA IMMUNITY
MUMPS ABS, IGG: 145 AU/mL (ref >10.9)
RUBEOLA AB, IGG: >300 AU/mL (ref >16.4)
Rubella Antibodies, IGG: 1.97 index (ref >0.99)

## 2021-09-15 LAB — VARICELLA ZOSTER ANTIBODY, IGG: Varicella zoster IgG: 203 index (ref >165)

## 2021-09-16 ENCOUNTER — Telehealth: Payer: Self-pay

## 2021-09-16 NOTE — Telephone Encounter (Signed)
Return call by patient regarding her Titer results.  Reviewed Titer results with her and she is aware of her Immunity and that she does not need any vaccine at this time.  She will call back later if she needs copies of the titers.  Dahlia Bailiff, RN ? ?

## 2021-09-16 NOTE — Telephone Encounter (Signed)
Telephone call to patient this morning regarding her Varicella Titer and MMR Titer results.  She is Immune and will not need any vaccine at this time.  Left message to return my call at [phone].  Dahlia Bailiff, RN ? ?

## 2021-09-16 NOTE — Telephone Encounter (Signed)
Telephone call this afternoon regarding patient Titer results.  Left a message for her to return my call at [phone].  Dahlia Bailiff, RN ? ?

## 2021-09-21 ENCOUNTER — Ambulatory Visit
Admission: EM | Admit: 2021-09-21 | Discharge: 2021-09-21 | Disposition: A | Discharge status: Home / Self Care | Payer: BC Managed Care – PPO | Attending: Family Medicine | Admitting: Family Medicine

## 2021-09-21 DIAGNOSIS — J3089 Other allergic rhinitis: Secondary | ICD-10-CM

## 2021-09-21 DIAGNOSIS — J4521 Mild intermittent asthma with (acute) exacerbation: Secondary | ICD-10-CM | POA: Diagnosis not present

## 2021-09-21 MED ORDER — PREDNISONE 20 MG PO TABS: 40.0000 mg | ORAL_TABLET | Freq: Every day | ORAL | 0 refills | Status: DC

## 2021-09-21 MED ORDER — CETIRIZINE HCL 10 MG PO TABS: 10.0000 mg | ORAL_TABLET | Freq: Every day | ORAL | 2 refills | Status: DC

## 2021-09-21 MED ORDER — FLUTICASONE PROPIONATE 50 MCG/ACT NA SUSP: 1.0000 | Freq: Two times a day (BID) | NASAL | 2 refills | Status: DC

## 2021-09-21 MED ORDER — PROMETHAZINE-DM 6.25-15 MG/5ML PO SYRP: 5.0000 mL | ORAL_SOLUTION | Freq: Four times a day (QID) | ORAL | 0 refills | Status: DC | PRN

## 2021-09-21 MED ORDER — ALBUTEROL SULFATE HFA 108 (90 BASE) MCG/ACT IN AERS: 1.0000 | INHALATION_SPRAY | Freq: Four times a day (QID) | RESPIRATORY_TRACT | 0 refills | Status: AC | PRN

## 2021-09-21 NOTE — ED Triage Notes (Addendum)
Patient presents to Urgent Care with complaints of persistent cough since 2 weeks ago. Patient reports sh was sick and has since gotten better but cough remains. Pt also reports 'waves in right ear" pt st she saw an ear doc for this but no change .  Pt also complains of right knee pain

## 2021-09-21 NOTE — ED Provider Notes (Signed)
[facility]    CSN: [id number] Arrival date & time: 09/21/21  1759      History   Chief Complaint Chief Complaint  Patient presents with   Cough    HPI Kristin [name] is a 56 y.o. female.   Presenting today with persistent cough productive of mucus x2 weeks.  Notes some pressure and popping in ears, nasal congestion, sinus pressure additionally.  Denies fever, chills, chest pain, shortness of breath, abdominal pain, nausea vomiting or diarrhea.  Has a history of asthma, seasonal allergies on albuterol as needed but not taking anything for allergies.  Trying Mucinex with minimal relief of symptoms.  No known sick contacts recently.   Past Medical History:  Diagnosis Date   Arthritis    knee, back   Asthma     There are no problems to display for this patient.   Past Surgical History:  Procedure Laterality Date   btl     COLONOSCOPY     DILATATION & CURETTAGE/HYSTEROSCOPY WITH MYOSURE N/A 04/29/2016   Procedure: DILATATION & CURETTAGE/HYSTEROSCOPY WITH MYOSURE;  Surgeon: Servando Salina, MD;  Location: [facility];  Service: Gynecology;  Laterality: N/A;   UPPER GI ENDOSCOPY      OB History   No obstetric history on file.      Home Medications    Prior to Admission medications   Medication Sig Start Date End Date Taking? Authorizing Provider  albuterol (VENTOLIN HFA) 108 (90 Base) MCG/ACT inhaler Inhale 1-2 puffs into the lungs every 6 (six) hours as needed for wheezing or shortness of breath. 09/21/21  Yes Volney American, PA-C  cetirizine (ZYRTEC ALLERGY) 10 MG tablet Take 1 tablet (10 mg total) by mouth daily. 09/21/21   Volney American, PA-C  fluticasone [facility]) 50 MCG/ACT nasal spray Place 1 spray into both nostrils 2 (two) times daily. 09/21/21   Volney American, PA-C  ibuprofen (ADVIL,MOTRIN) 800 MG tablet Take 1 tablet (800 mg total) by mouth 3 (three) times daily. 04/29/16   Servando Salina, MD  Lifitegrast [facility]) 5  % SOLN Apply 1 drop to eye 2 (two) times daily.    [provider]  Multiple Vitamins-Calcium (ONE-A-DAY WOMENS PO) Take 1 tablet by mouth every morning.    [provider]  Polyethyl Glycol-Propyl Glycol (SYSTANE OP) Apply 1 drop to eye 2 (two) times daily.    [provider]  predniSONE (DELTASONE) 20 MG tablet Take 2 tablets (40 mg total) by mouth daily with breakfast. 09/21/21   Volney American, PA-C  promethazine-dextromethorphan (PROMETHAZINE-DM) 6.25-15 MG/5ML syrup Take 5 mLs by mouth 4 (four) times daily as needed for cough. 09/21/21   Volney American, PA-C    Family History Family History  Problem Relation Age of Onset   Breast cancer Neg Hx     Social History Social History   Tobacco Use   Smoking status: Never   Smokeless tobacco: Never  Substance Use Topics   Alcohol use: No   Drug use: No     Allergies   Patient has no known allergies.   Review of Systems Review of Systems Per HPI  Physical Exam Triage Vital Signs ED Triage Vitals  Enc Vitals Group     BP 09/21/21 1810 114/67     Pulse Rate 09/21/21 1810 68     Resp 09/21/21 1810 18     Temp 09/21/21 1810 97.9 F (36.6 C)     Temp src --  SpO2 09/21/21 1810 97 %     Weight --      Height --      Head Circumference --      Peak Flow --      Pain Score 09/21/21 1807 8     Pain Loc --      Pain Edu? --      Excl. in [place]? --    No data found.  Updated Vital Signs BP 114/67   Pulse 68   Temp 97.9 F (36.6 C)   Resp 18   SpO2 97%   Visual Acuity Right Eye Distance:   Left Eye Distance:   Bilateral Distance:    Right Eye Near:   Left Eye Near:    Bilateral Near:     Physical Exam Vitals and nursing note reviewed.  Constitutional:      Appearance: Normal appearance. She is not ill-appearing.  HENT:     Head: Atraumatic.     Ears:     Comments: Bilateral middle ear effusion    Nose: Rhinorrhea present.     Mouth/Throat:     Mouth: Mucous  membranes are moist.     Pharynx: Posterior oropharyngeal erythema present. No oropharyngeal exudate.  Eyes:     Extraocular Movements: Extraocular movements intact.     Conjunctiva/sclera: Conjunctivae normal.  Cardiovascular:     Rate and Rhythm: Normal rate and regular rhythm.     Heart sounds: Normal heart sounds.  Pulmonary:     Effort: Pulmonary effort is normal.     Breath sounds: Normal breath sounds. No wheezing or rales.  Musculoskeletal:        General: Normal range of motion.     Cervical back: Normal range of motion and neck supple.  Skin:    General: Skin is warm and dry.  Neurological:     Mental Status: She is alert and oriented to person, place, and time.  Psychiatric:        Mood and Affect: Mood normal.        Thought Content: Thought content normal.        Judgment: Judgment normal.   UC Treatments / Results  Labs (all labs ordered are listed, but only abnormal results are displayed) Labs Reviewed - No data to display  EKG  Radiology No results found.  Procedures Procedures (including critical care time)  Medications Ordered in UC Medications - No data to display  Initial Impression / Assessment and Plan / UC Course  I have reviewed the triage vital signs and the nursing notes.  Pertinent labs & imaging results that were available during my care of the patient were reviewed by me and considered in my medical decision making (see chart for details).     Suspect seasonal allergy exacerbation leading to an asthma exacerbation.  Treat with prednisone, Flonase, Zyrtec, Phenergan DM and will refill albuterol inhaler additionally.  Return for acutely worsening symptoms.  Final Clinical Impressions(s) / UC Diagnoses   Final diagnoses:  Seasonal allergic rhinitis due to other allergic trigger  Mild intermittent asthma with acute exacerbation   Discharge Instructions   None    ED Prescriptions     Medication Sig Dispense Auth. Provider    predniSONE (DELTASONE) 20 MG tablet  (Status: Discontinued) Take 2 tablets (40 mg total) by mouth daily with breakfast. 10 tablet Volney American, PA-C   fluticasone [facility]) 50 MCG/ACT nasal spray  (Status: Discontinued) Place 1 spray into both nostrils 2 (two) times  daily. 16 g Volney American, [place]   cetirizine (ZYRTEC ALLERGY) 10 MG tablet  (Status: Discontinued) Take 1 tablet (10 mg total) by mouth daily. 30 tablet Volney American, [place]   promethazine-dextromethorphan (PROMETHAZINE-DM) 6.25-15 MG/5ML syrup  (Status: Discontinued) Take 5 mLs by mouth 4 (four) times daily as needed for cough. 100 mL Volney American, [place]   promethazine-dextromethorphan (PROMETHAZINE-DM) 6.25-15 MG/5ML syrup Take 5 mLs by mouth 4 (four) times daily as needed for cough. 100 mL Volney American, PA-C   predniSONE (DELTASONE) 20 MG tablet Take 2 tablets (40 mg total) by mouth daily with breakfast. 10 tablet Volney American, PA-C   fluticasone [facility]) 50 MCG/ACT nasal spray Place 1 spray into both nostrils 2 (two) times daily. 16 g Volney American, [place]   cetirizine (ZYRTEC ALLERGY) 10 MG tablet Take 1 tablet (10 mg total) by mouth daily. 30 tablet Volney American, [place]   albuterol (VENTOLIN HFA) 108 (90 Base) MCG/ACT inhaler Inhale 1-2 puffs into the lungs every 6 (six) hours as needed for wheezing or shortness of breath. 18 g Volney American, [place]      PDMP not reviewed this encounter.   Volney American, [place] 09/21/21 1934

## 2021-10-08 NOTE — Addendum Note (Signed)
Addended by: Cletis Media on: 10/08/2021 03:20 PM   Modules accepted: Orders

## 2021-10-18 ENCOUNTER — Other Ambulatory Visit: Payer: Self-pay | Admitting: Family Medicine

## 2021-10-19 NOTE — Telephone Encounter (Signed)
Requested Prescriptions  Pending Prescriptions Disp Refills  . promethazine-dextromethorphan (PROMETHAZINE-DM) 6.25-15 MG/5ML syrup [Pharmacy Med Name: Promethazine-DM 6.25-15 MG/5ML Oral Syrup] 100 mL 0    Sig: TAKE 5 ML BY MOUTH  4 TIMES DAILY AS NEEDED FOR COUGH     There is no refill protocol information for this order

## 2021-11-04 ENCOUNTER — Telehealth: Payer: Self-pay | Admitting: Family Medicine

## 2021-11-04 NOTE — Telephone Encounter (Signed)
Return call to patient today.  After checking the NCIR and Epic, the patient had MMR and Varicella Titer drawn.  She would like a copy of her Titers.  ROI signed 08-2021.  Patient needs a copy for school and cannot drive from [place] to pick up.  She gives verbal consent in addition to the ROI to release copies to her via mail to her address on file in [place].  Copies to be mailed today as patient requested.  Dahlia Bailiff, RN

## 2021-11-04 NOTE — Telephone Encounter (Signed)
Pt would like to speak to a nurse regarding her vaccine report in [place].  She said that she got her MMR and Varicella here in May but that it is not showing up on her NCIR.  She would like to speak to a nurse to see if that can be added.

## 2021-12-01 ENCOUNTER — Telehealth: Payer: Self-pay | Admitting: Family Medicine

## 2021-12-01 NOTE — Telephone Encounter (Signed)
Phone call attempted to pt. No answer and message stating no voicemail box set up. Plan to call pt again tomorrow. Josie Saunders, RN

## 2021-12-01 NOTE — Telephone Encounter (Signed)
Pt states that she came to [facility] on 09/14/21 for Varicella and MMR titer. She states that the results were mailed to her and she submitted the results to her school. She states that she received an email from the school stating that the MMR was not approved because the results were only for one component of the MMR. She wants to speak with a nurse.

## 2021-12-02 NOTE — Telephone Encounter (Signed)
Phone call to pt. She explains she submitted the titer results for MMR and Varicella to her school, but the school says they did not receive each result of the three components of MMR.   RN explained that each result was mailed to her. RN recommended that copy of MMR and varicella titer results be re-mailed to her and she agrees.  She also asks that MyChart link be resent to her so that she can have access to pt portal.   RN sent MyChart text link to pt. RN mailed copy of MMR and varicella titer results to pt.Josie Saunders, RN

## 2021-12-13 ENCOUNTER — Other Ambulatory Visit: Payer: Self-pay | Admitting: Family Medicine

## 2021-12-14 NOTE — Telephone Encounter (Signed)
Med prescribed at an [facility]- pt needs to contact PCP for any further refills  Requested Prescriptions  Refused Prescriptions Disp Refills  . cetirizine (ZYRTEC) 10 MG tablet [Pharmacy Med Name: Cetirizine HCl 10 MG Oral Tablet] 30 tablet 0    Sig: Take 1 tablet by mouth once daily     There is no refill protocol information for this order

## 2021-12-23 IMAGING — MG MM DIGITAL SCREENING BILAT W/ TOMO AND CAD
8 series · 8 of 24 positions shown · non-contrast
Comparison: Previous exam(s).

CLINICAL DATA: Screening.

EXAM:
DIGITAL SCREENING BILATERAL MAMMOGRAM WITH TOMOSYNTHESIS AND CAD
TECHNIQUE: Bilateral screening digital craniocaudal and mediolateral oblique
mammograms were obtained. Bilateral screening digital breast
tomosynthesis was performed. The images were evaluated with
computer-aided detection.

[L CC synth-2D]
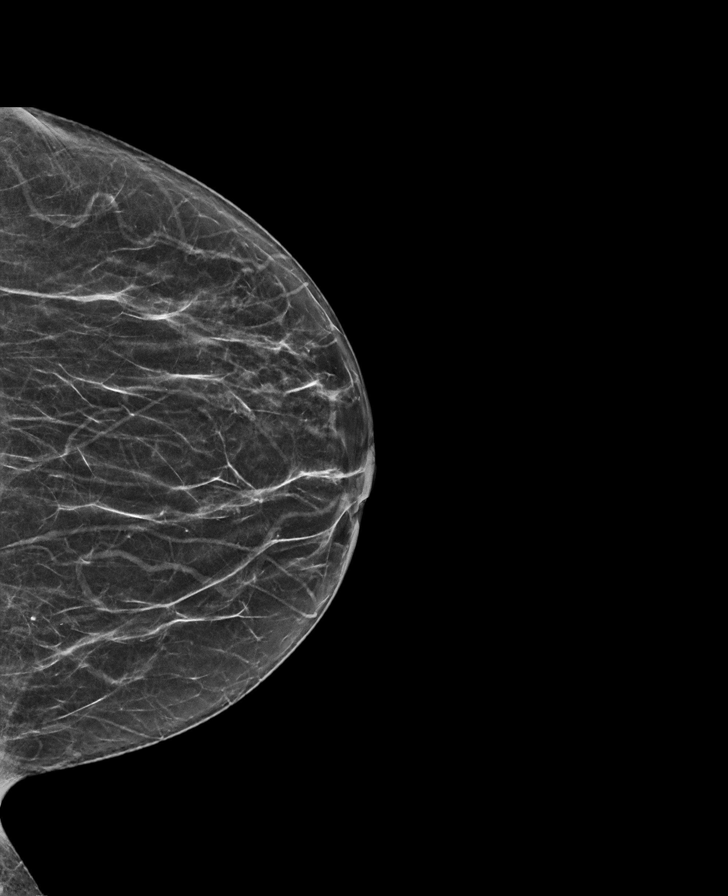

[L MLO synth-2D]
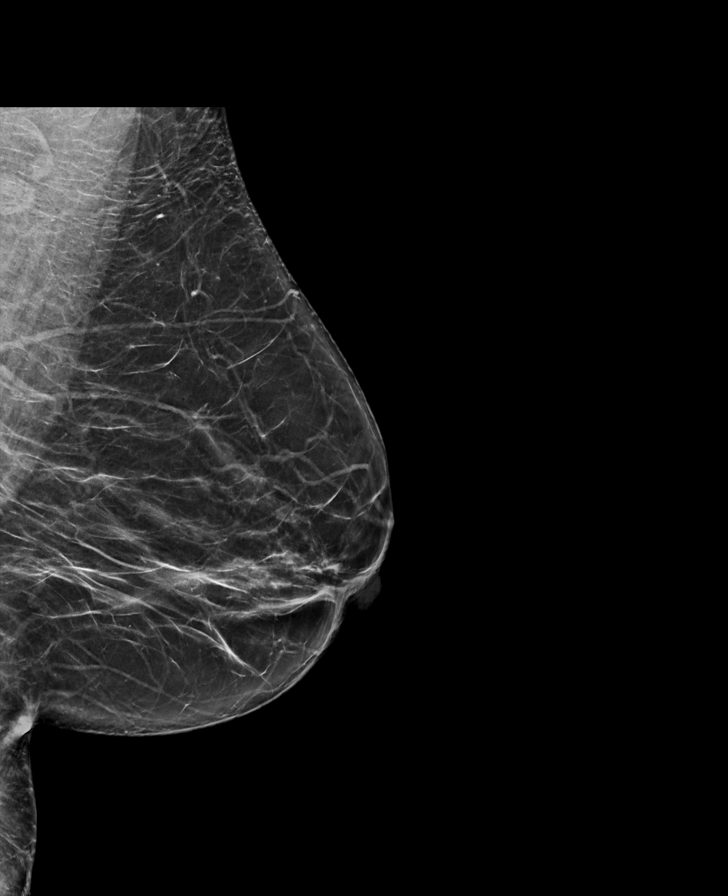

[R CC synth-2D]
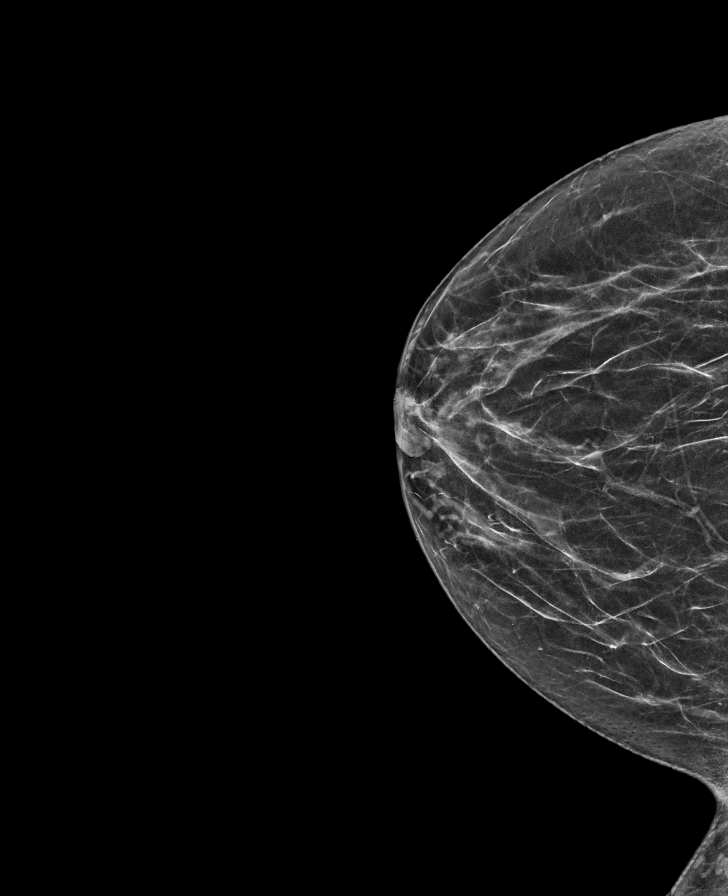

[R MLO synth-2D]
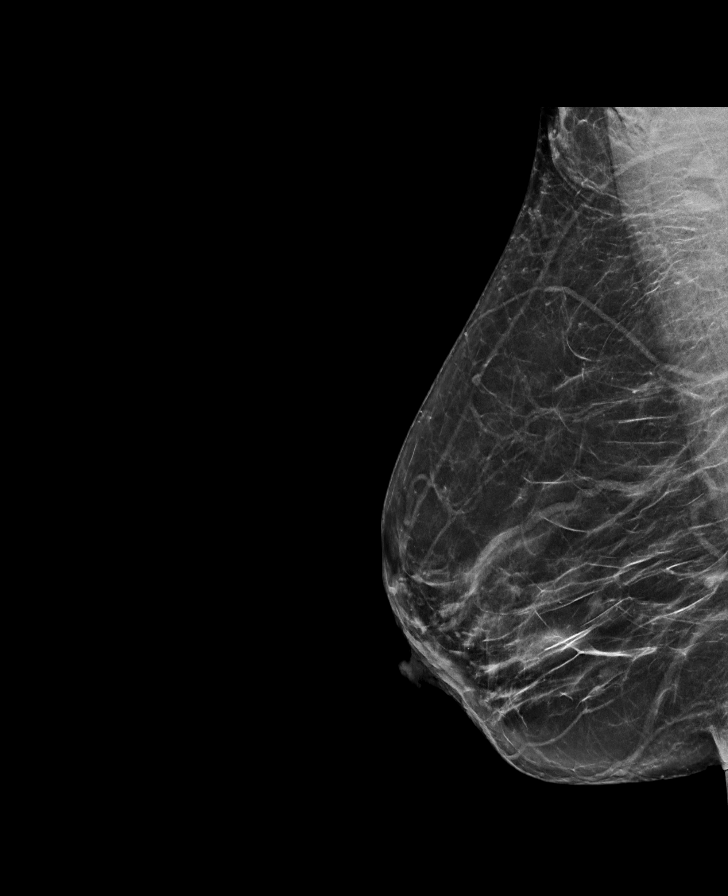

[R CC tomo · tomo slice 28/55.0]
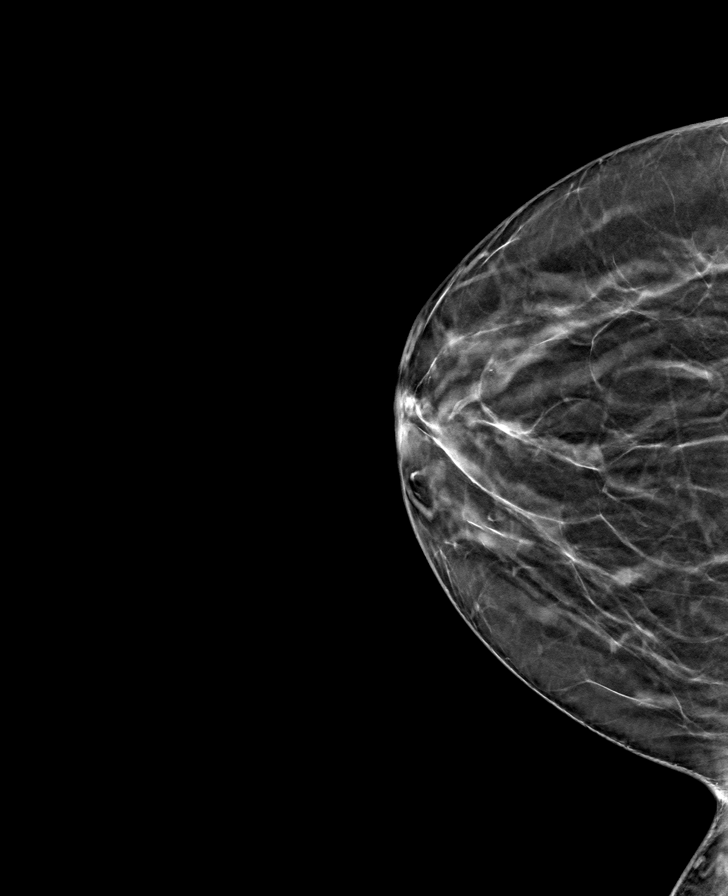

[R MLO tomo · tomo slice 35/70.0]
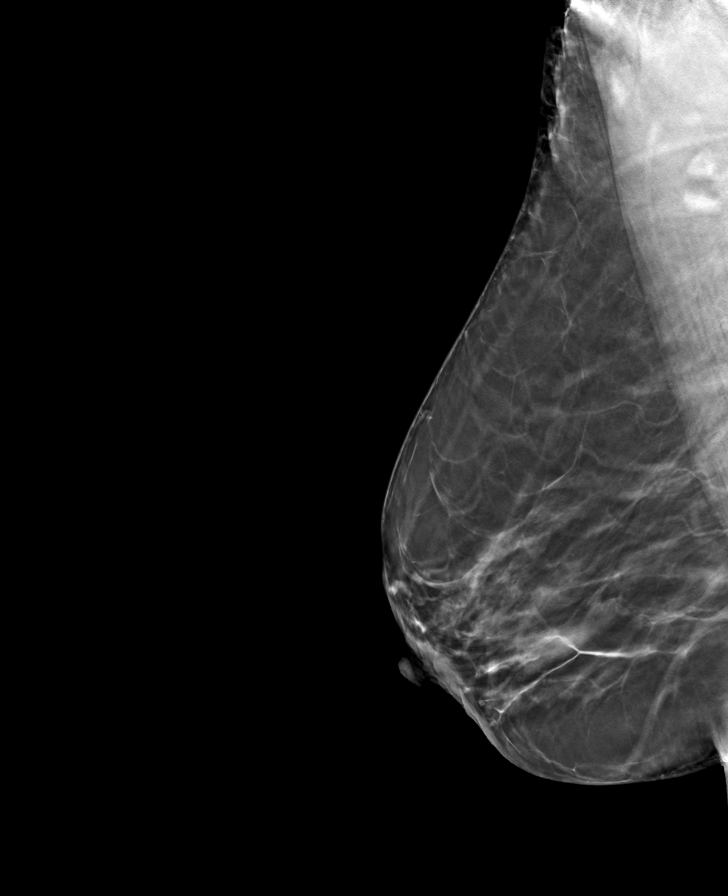

[L MLO tomo · tomo slice 35/70.0]
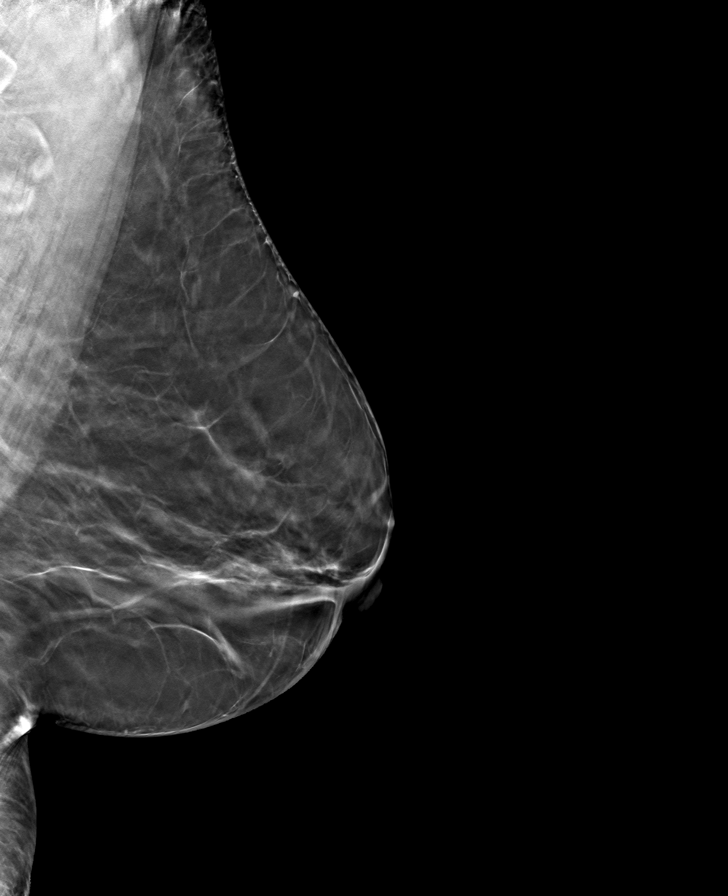

[L CC tomo · tomo slice 29/56.0]
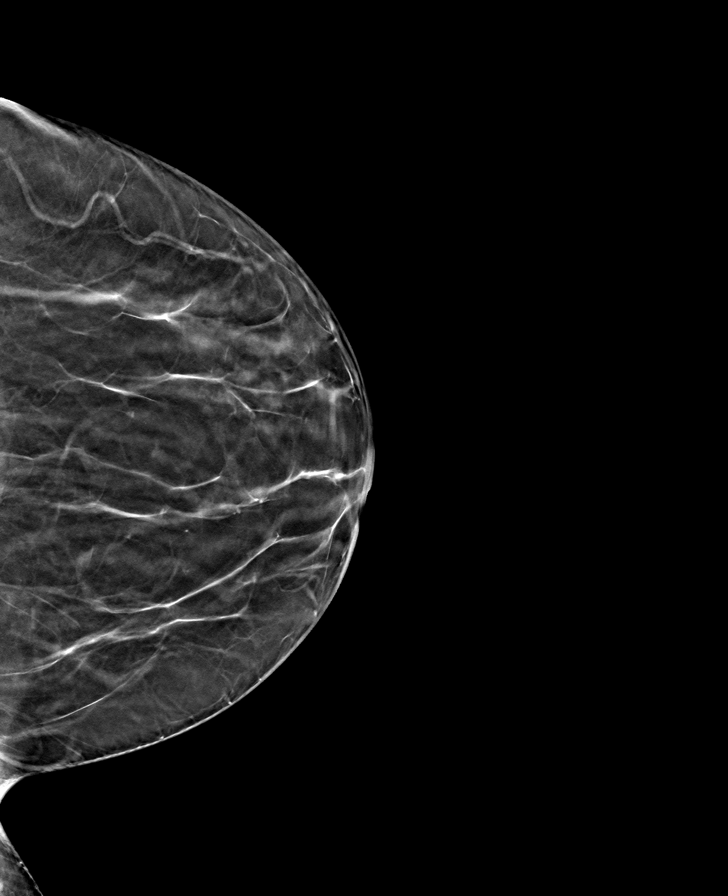

[8 of 24 positions shown; findings below may reference images not displayed]

ACR Breast Density Category b: There are scattered areas of
fibroglandular density.
FINDINGS: There are no findings suspicious for malignancy.
IMPRESSION: No mammographic evidence of malignancy. A result letter of this
screening mammogram will be mailed directly to the patient.

RECOMMENDATION:
Screening mammogram in one year. (Code:51-O-LD2)

BI-RADS CATEGORY  1: Negative.

## 2022-01-08 ENCOUNTER — Other Ambulatory Visit: Payer: Self-pay | Admitting: Family Medicine

## 2022-01-08 NOTE — Telephone Encounter (Signed)
Urgent Care Patient Requested Prescriptions  Pending Prescriptions Disp Refills  . cetirizine (ZYRTEC) 10 MG tablet [Pharmacy Med Name: Cetirizine HCl 10 MG Oral Tablet] 30 tablet 0    Sig: Take 1 tablet by mouth once daily     There is no refill protocol information for this order

## 2022-01-18 DIAGNOSIS — M1990 Unspecified osteoarthritis, unspecified site: Secondary | ICD-10-CM | POA: Diagnosis not present

## 2022-01-18 DIAGNOSIS — K219 Gastro-esophageal reflux disease without esophagitis: Secondary | ICD-10-CM | POA: Diagnosis not present

## 2022-01-18 DIAGNOSIS — E785 Hyperlipidemia, unspecified: Secondary | ICD-10-CM | POA: Diagnosis not present

## 2022-02-05 ENCOUNTER — Other Ambulatory Visit: Payer: Self-pay | Admitting: Obstetrics & Gynecology

## 2022-02-05 DIAGNOSIS — Z1231 Encounter for screening mammogram for malignant neoplasm of breast: Secondary | ICD-10-CM

## 2022-02-10 DIAGNOSIS — E785 Hyperlipidemia, unspecified: Secondary | ICD-10-CM | POA: Diagnosis not present

## 2022-03-04 DIAGNOSIS — Z23 Encounter for immunization: Secondary | ICD-10-CM | POA: Diagnosis not present

## 2022-03-09 ENCOUNTER — Ambulatory Visit
Admission: RE | Admit: 2022-03-09 | Discharge: 2022-03-09 | Disposition: A | Discharge status: Home / Self Care | Payer: BC Managed Care – PPO | Source: Ambulatory Visit | Attending: Obstetrics & Gynecology | Admitting: Obstetrics & Gynecology

## 2022-03-09 DIAGNOSIS — Z1231 Encounter for screening mammogram for malignant neoplasm of breast: Secondary | ICD-10-CM | POA: Diagnosis not present

## 2022-03-16 DIAGNOSIS — H16143 Punctate keratitis, bilateral: Secondary | ICD-10-CM | POA: Diagnosis not present

## 2022-03-16 DIAGNOSIS — H16223 Keratoconjunctivitis sicca, not specified as Sjogren's, bilateral: Secondary | ICD-10-CM | POA: Diagnosis not present

## 2022-03-17 DIAGNOSIS — I959 Hypotension, unspecified: Secondary | ICD-10-CM | POA: Diagnosis not present

## 2022-03-17 DIAGNOSIS — R001 Bradycardia, unspecified: Secondary | ICD-10-CM | POA: Diagnosis not present

## 2022-03-17 DIAGNOSIS — R42 Dizziness and giddiness: Secondary | ICD-10-CM | POA: Diagnosis not present

## 2022-03-18 DIAGNOSIS — K219 Gastro-esophageal reflux disease without esophagitis: Secondary | ICD-10-CM | POA: Diagnosis not present

## 2022-03-18 DIAGNOSIS — R001 Bradycardia, unspecified: Secondary | ICD-10-CM | POA: Diagnosis not present

## 2022-03-18 DIAGNOSIS — E785 Hyperlipidemia, unspecified: Secondary | ICD-10-CM | POA: Diagnosis not present

## 2022-04-12 DIAGNOSIS — A041 Enterotoxigenic Escherichia coli infection: Secondary | ICD-10-CM | POA: Diagnosis not present

## 2022-04-12 DIAGNOSIS — B54 Unspecified malaria: Secondary | ICD-10-CM | POA: Diagnosis not present

## 2022-04-12 DIAGNOSIS — J45909 Unspecified asthma, uncomplicated: Secondary | ICD-10-CM | POA: Diagnosis not present

## 2022-06-28 DIAGNOSIS — H9311 Tinnitus, right ear: Secondary | ICD-10-CM | POA: Diagnosis not present

## 2022-06-28 DIAGNOSIS — H9042 Sensorineural hearing loss, unilateral, left ear, with unrestricted hearing on the contralateral side: Secondary | ICD-10-CM | POA: Insufficient documentation

## 2022-07-15 DIAGNOSIS — H16223 Keratoconjunctivitis sicca, not specified as Sjogren's, bilateral: Secondary | ICD-10-CM | POA: Diagnosis not present

## 2022-07-15 DIAGNOSIS — H2513 Age-related nuclear cataract, bilateral: Secondary | ICD-10-CM | POA: Diagnosis not present

## 2022-07-15 DIAGNOSIS — H43812 Vitreous degeneration, left eye: Secondary | ICD-10-CM | POA: Diagnosis not present

## 2022-07-15 DIAGNOSIS — H40012 Open angle with borderline findings, low risk, left eye: Secondary | ICD-10-CM | POA: Diagnosis not present

## 2022-08-11 DIAGNOSIS — Z1151 Encounter for screening for human papillomavirus (HPV): Secondary | ICD-10-CM | POA: Diagnosis not present

## 2022-08-11 DIAGNOSIS — Z01419 Encounter for gynecological examination (general) (routine) without abnormal findings: Secondary | ICD-10-CM | POA: Diagnosis not present

## 2022-08-11 DIAGNOSIS — Z124 Encounter for screening for malignant neoplasm of cervix: Secondary | ICD-10-CM | POA: Diagnosis not present

## 2022-09-22 DIAGNOSIS — J45909 Unspecified asthma, uncomplicated: Secondary | ICD-10-CM | POA: Diagnosis not present

## 2022-09-22 DIAGNOSIS — R0600 Dyspnea, unspecified: Secondary | ICD-10-CM | POA: Diagnosis not present

## 2022-09-22 DIAGNOSIS — R059 Cough, unspecified: Secondary | ICD-10-CM | POA: Diagnosis not present

## 2022-09-24 ENCOUNTER — Emergency Department (HOSPITAL_COMMUNITY): Payer: BC Managed Care – PPO

## 2022-09-24 ENCOUNTER — Emergency Department (HOSPITAL_COMMUNITY)
Admission: EM | Admit: 2022-09-24 | Discharge: 2022-09-24 | Disposition: A | Discharge status: Home / Self Care | Payer: BC Managed Care – PPO | Attending: Emergency Medicine | Admitting: Emergency Medicine

## 2022-09-24 ENCOUNTER — Encounter (HOSPITAL_COMMUNITY): Payer: Self-pay

## 2022-09-24 ENCOUNTER — Other Ambulatory Visit: Payer: Self-pay

## 2022-09-24 DIAGNOSIS — M542 Cervicalgia: Secondary | ICD-10-CM | POA: Diagnosis not present

## 2022-09-24 DIAGNOSIS — Y92410 Unspecified street and highway as the place of occurrence of the external cause: Secondary | ICD-10-CM | POA: Insufficient documentation

## 2022-09-24 DIAGNOSIS — S0990XA Unspecified injury of head, initial encounter: Secondary | ICD-10-CM | POA: Diagnosis not present

## 2022-09-24 DIAGNOSIS — M549 Dorsalgia, unspecified: Secondary | ICD-10-CM | POA: Insufficient documentation

## 2022-09-24 DIAGNOSIS — R519 Headache, unspecified: Secondary | ICD-10-CM | POA: Diagnosis not present

## 2022-09-24 DIAGNOSIS — M545 Low back pain, unspecified: Secondary | ICD-10-CM | POA: Diagnosis not present

## 2022-09-24 DIAGNOSIS — R079 Chest pain, unspecified: Secondary | ICD-10-CM | POA: Diagnosis not present

## 2022-09-24 DIAGNOSIS — M47812 Spondylosis without myelopathy or radiculopathy, cervical region: Secondary | ICD-10-CM | POA: Diagnosis not present

## 2022-09-24 DIAGNOSIS — R0789 Other chest pain: Secondary | ICD-10-CM | POA: Diagnosis not present

## 2022-09-24 MED ORDER — ETODOLAC 400 MG PO TABS: 400.0000 mg | ORAL_TABLET | Freq: Two times a day (BID) | ORAL | 0 refills | Status: DC

## 2022-09-24 MED ORDER — METHOCARBAMOL 500 MG PO TABS
500.0000 mg | ORAL_TABLET | Freq: Once | ORAL | Status: AC
  Administered 2022-09-24: 500 mg via ORAL
  Filled 2022-09-24: qty 1

## 2022-09-24 MED ORDER — METHOCARBAMOL 500 MG PO TABS: 500.0000 mg | ORAL_TABLET | Freq: Two times a day (BID) | ORAL | 0 refills | Status: DC

## 2022-09-24 NOTE — Discharge Instructions (Addendum)
Your workup is reassuring.  Concerning findings on your chest x-ray, or CT imaging.  I have sent in muscle relaxer and anti-inflammatory into the pharmacy for you.  Muscle relaxer will make you drowsy.  Do not drive or do anything else that is dangerous after taking it.  Lodine is an anti-inflammatory medication do not combine this with other anti-inflammatory such as ibuprofen.  Follow-up with your primary care doctor.
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
## 2022-11-24 DIAGNOSIS — E782 Mixed hyperlipidemia: Secondary | ICD-10-CM | POA: Diagnosis not present

## 2022-11-24 DIAGNOSIS — J45909 Unspecified asthma, uncomplicated: Secondary | ICD-10-CM | POA: Diagnosis not present

## 2022-11-24 DIAGNOSIS — F411 Generalized anxiety disorder: Secondary | ICD-10-CM | POA: Diagnosis not present

## 2022-11-24 DIAGNOSIS — K219 Gastro-esophageal reflux disease without esophagitis: Secondary | ICD-10-CM | POA: Diagnosis not present

## 2022-11-24 DIAGNOSIS — E785 Hyperlipidemia, unspecified: Secondary | ICD-10-CM | POA: Diagnosis not present

## 2022-12-08 DIAGNOSIS — Z117 Encounter for testing for latent tuberculosis infection: Secondary | ICD-10-CM | POA: Diagnosis not present

## 2022-12-08 DIAGNOSIS — Z111 Encounter for screening for respiratory tuberculosis: Secondary | ICD-10-CM | POA: Diagnosis not present

## 2023-01-17 DIAGNOSIS — N907 Vulvar cyst: Secondary | ICD-10-CM | POA: Diagnosis not present

## 2023-02-28 ENCOUNTER — Other Ambulatory Visit: Payer: Self-pay | Admitting: Obstetrics & Gynecology

## 2023-02-28 DIAGNOSIS — Z1231 Encounter for screening mammogram for malignant neoplasm of breast: Secondary | ICD-10-CM

## 2023-02-28 DIAGNOSIS — K219 Gastro-esophageal reflux disease without esophagitis: Secondary | ICD-10-CM | POA: Diagnosis not present

## 2023-02-28 DIAGNOSIS — E782 Mixed hyperlipidemia: Secondary | ICD-10-CM | POA: Diagnosis not present

## 2023-02-28 DIAGNOSIS — J45909 Unspecified asthma, uncomplicated: Secondary | ICD-10-CM | POA: Diagnosis not present

## 2023-02-28 DIAGNOSIS — F411 Generalized anxiety disorder: Secondary | ICD-10-CM | POA: Diagnosis not present

## 2023-03-17 DIAGNOSIS — E785 Hyperlipidemia, unspecified: Secondary | ICD-10-CM | POA: Diagnosis not present

## 2023-03-25 ENCOUNTER — Ambulatory Visit: Payer: BC Managed Care – PPO

## 2023-05-03 ENCOUNTER — Ambulatory Visit
Admission: RE | Admit: 2023-05-03 | Discharge: 2023-05-03 | Disposition: A | Discharge status: Home / Self Care | Payer: BC Managed Care – PPO | Source: Ambulatory Visit | Attending: Obstetrics & Gynecology | Admitting: Obstetrics & Gynecology

## 2023-05-03 DIAGNOSIS — Z1231 Encounter for screening mammogram for malignant neoplasm of breast: Secondary | ICD-10-CM | POA: Diagnosis not present

## 2023-06-24 DIAGNOSIS — K219 Gastro-esophageal reflux disease without esophagitis: Secondary | ICD-10-CM | POA: Diagnosis not present

## 2023-06-24 DIAGNOSIS — M150 Primary generalized (osteo)arthritis: Secondary | ICD-10-CM | POA: Diagnosis not present

## 2023-06-24 DIAGNOSIS — E782 Mixed hyperlipidemia: Secondary | ICD-10-CM | POA: Diagnosis not present

## 2023-06-24 DIAGNOSIS — R634 Abnormal weight loss: Secondary | ICD-10-CM | POA: Diagnosis not present

## 2023-06-29 ENCOUNTER — Other Ambulatory Visit (HOSPITAL_COMMUNITY): Payer: Self-pay | Admitting: Gastroenterology

## 2023-06-29 DIAGNOSIS — R634 Abnormal weight loss: Secondary | ICD-10-CM | POA: Diagnosis not present

## 2023-06-29 DIAGNOSIS — R1011 Right upper quadrant pain: Secondary | ICD-10-CM

## 2023-06-29 DIAGNOSIS — R1032 Left lower quadrant pain: Secondary | ICD-10-CM

## 2023-06-29 DIAGNOSIS — F411 Generalized anxiety disorder: Secondary | ICD-10-CM | POA: Diagnosis not present

## 2023-06-29 DIAGNOSIS — R1031 Right lower quadrant pain: Secondary | ICD-10-CM | POA: Diagnosis not present

## 2023-06-29 DIAGNOSIS — E785 Hyperlipidemia, unspecified: Secondary | ICD-10-CM | POA: Diagnosis not present

## 2023-06-29 DIAGNOSIS — E782 Mixed hyperlipidemia: Secondary | ICD-10-CM | POA: Diagnosis not present

## 2023-06-29 DIAGNOSIS — K219 Gastro-esophageal reflux disease without esophagitis: Secondary | ICD-10-CM | POA: Diagnosis not present

## 2023-07-14 ENCOUNTER — Ambulatory Visit (HOSPITAL_COMMUNITY)
Admission: RE | Admit: 2023-07-14 | Discharge: 2023-07-14 | Disposition: A | Discharge status: Home / Self Care | Source: Ambulatory Visit | Attending: Gastroenterology | Admitting: Gastroenterology

## 2023-07-14 DIAGNOSIS — K7689 Other specified diseases of liver: Secondary | ICD-10-CM | POA: Diagnosis not present

## 2023-07-14 DIAGNOSIS — R1030 Lower abdominal pain, unspecified: Secondary | ICD-10-CM | POA: Diagnosis not present

## 2023-07-14 DIAGNOSIS — R1011 Right upper quadrant pain: Secondary | ICD-10-CM | POA: Insufficient documentation

## 2023-07-14 DIAGNOSIS — R1032 Left lower quadrant pain: Secondary | ICD-10-CM | POA: Insufficient documentation

## 2023-07-14 DIAGNOSIS — R1900 Intra-abdominal and pelvic swelling, mass and lump, unspecified site: Secondary | ICD-10-CM | POA: Diagnosis not present

## 2023-07-14 DIAGNOSIS — R932 Abnormal findings on diagnostic imaging of liver and biliary tract: Secondary | ICD-10-CM | POA: Diagnosis not present

## 2023-07-14 MED ORDER — IOHEXOL 300 MG/ML  SOLN
100.0000 mL | Freq: Once | INTRAMUSCULAR | Status: AC | PRN
  Administered 2023-07-14: 100 mL via INTRAVENOUS

## 2023-07-27 DIAGNOSIS — R634 Abnormal weight loss: Secondary | ICD-10-CM | POA: Diagnosis not present

## 2023-08-05 DIAGNOSIS — Z1151 Encounter for screening for human papillomavirus (HPV): Secondary | ICD-10-CM | POA: Diagnosis not present

## 2023-08-05 DIAGNOSIS — Z01419 Encounter for gynecological examination (general) (routine) without abnormal findings: Secondary | ICD-10-CM | POA: Diagnosis not present

## 2023-08-05 DIAGNOSIS — Z124 Encounter for screening for malignant neoplasm of cervix: Secondary | ICD-10-CM | POA: Diagnosis not present

## 2023-08-05 DIAGNOSIS — Z6831 Body mass index (BMI) 31.0-31.9, adult: Secondary | ICD-10-CM | POA: Diagnosis not present

## 2023-08-06 DIAGNOSIS — D259 Leiomyoma of uterus, unspecified: Secondary | ICD-10-CM | POA: Insufficient documentation

## 2023-08-25 DIAGNOSIS — H5203 Hypermetropia, bilateral: Secondary | ICD-10-CM | POA: Diagnosis not present

## 2023-08-25 DIAGNOSIS — H43811 Vitreous degeneration, right eye: Secondary | ICD-10-CM | POA: Diagnosis not present

## 2023-08-25 DIAGNOSIS — H524 Presbyopia: Secondary | ICD-10-CM | POA: Diagnosis not present

## 2023-08-25 DIAGNOSIS — H40012 Open angle with borderline findings, low risk, left eye: Secondary | ICD-10-CM | POA: Diagnosis not present

## 2023-08-25 DIAGNOSIS — H2513 Age-related nuclear cataract, bilateral: Secondary | ICD-10-CM | POA: Diagnosis not present

## 2023-08-25 DIAGNOSIS — H16223 Keratoconjunctivitis sicca, not specified as Sjogren's, bilateral: Secondary | ICD-10-CM | POA: Diagnosis not present

## 2023-09-13 ENCOUNTER — Other Ambulatory Visit: Payer: Self-pay

## 2023-09-13 ENCOUNTER — Encounter (HOSPITAL_COMMUNITY): Payer: Self-pay

## 2023-09-13 ENCOUNTER — Emergency Department (HOSPITAL_COMMUNITY)
Admission: EM | Admit: 2023-09-13 | Discharge: 2023-09-13 | Disposition: A | Discharge status: Home / Self Care | Attending: Emergency Medicine | Admitting: Emergency Medicine

## 2023-09-13 ENCOUNTER — Emergency Department (HOSPITAL_COMMUNITY)

## 2023-09-13 DIAGNOSIS — E782 Mixed hyperlipidemia: Secondary | ICD-10-CM | POA: Diagnosis not present

## 2023-09-13 DIAGNOSIS — K219 Gastro-esophageal reflux disease without esophagitis: Secondary | ICD-10-CM | POA: Diagnosis not present

## 2023-09-13 DIAGNOSIS — H9311 Tinnitus, right ear: Secondary | ICD-10-CM | POA: Diagnosis not present

## 2023-09-13 DIAGNOSIS — R911 Solitary pulmonary nodule: Secondary | ICD-10-CM | POA: Diagnosis not present

## 2023-09-13 DIAGNOSIS — M150 Primary generalized (osteo)arthritis: Secondary | ICD-10-CM | POA: Diagnosis not present

## 2023-09-13 DIAGNOSIS — D649 Anemia, unspecified: Secondary | ICD-10-CM | POA: Insufficient documentation

## 2023-09-13 DIAGNOSIS — R918 Other nonspecific abnormal finding of lung field: Secondary | ICD-10-CM | POA: Diagnosis not present

## 2023-09-13 DIAGNOSIS — R634 Abnormal weight loss: Secondary | ICD-10-CM | POA: Diagnosis not present

## 2023-09-13 DIAGNOSIS — N390 Urinary tract infection, site not specified: Secondary | ICD-10-CM | POA: Diagnosis not present

## 2023-09-13 LAB — CBC WITH DIFFERENTIAL/PLATELET
Abs Immature Granulocytes: 0.02 10*3/uL (ref 0.00–0.07)
Basophils Absolute: 0 10*3/uL (ref 0.0–0.1)
Basophils Relative: 1 %
Eosinophils Absolute: 0.1 10*3/uL (ref 0.0–0.5)
Eosinophils Relative: 1 %
HCT: 27.8 % — ABNORMAL LOW (ref 36.0–46.0)
Hemoglobin: 8.6 g/dL — ABNORMAL LOW (ref 12.0–15.0)
Immature Granulocytes: 0 %
Lymphocytes Relative: 18 %
Lymphs Abs: 1.1 10*3/uL (ref 0.7–4.0)
MCH: 23 pg — ABNORMAL LOW (ref 26.0–34.0)
MCHC: 30.9 g/dL (ref 30.0–36.0)
MCV: 74.3 fL — ABNORMAL LOW (ref 80.0–100.0)
Monocytes Absolute: 0.8 10*3/uL (ref 0.1–1.0)
Monocytes Relative: 12 %
Neutro Abs: 4.3 10*3/uL (ref 1.7–7.7)
Neutrophils Relative %: 68 %
Platelets: 676 10*3/uL — ABNORMAL HIGH (ref 150–400)
RBC: 3.74 MIL/uL — ABNORMAL LOW (ref 3.87–5.11)
RDW: 17.4 % — ABNORMAL HIGH (ref 11.5–15.5)
WBC: 6.3 10*3/uL (ref 4.0–10.5)
nRBC: 0 % (ref 0.0–0.2)

## 2023-09-13 LAB — RETICULOCYTES
Immature Retic Fract: 25.9 % — ABNORMAL HIGH (ref 2.3–15.9)
RBC.: 3.76 MIL/uL — ABNORMAL LOW (ref 3.87–5.11)
Retic Count, Absolute: 56.8 10*3/uL (ref 19.0–186.0)
Retic Ct Pct: 1.5 % (ref 0.4–3.1)

## 2023-09-13 LAB — COMPREHENSIVE METABOLIC PANEL WITH GFR
ALT: 12 U/L (ref 0–44)
AST: 17 U/L (ref 15–41)
Albumin: 2.4 g/dL — ABNORMAL LOW (ref 3.5–5.0)
Alkaline Phosphatase: 82 U/L (ref 38–126)
Anion gap: 10 (ref 5–15)
BUN: 12 mg/dL (ref 6–20)
CO2: 23 mmol/L (ref 22–32)
Calcium: 8.5 mg/dL — ABNORMAL LOW (ref 8.9–10.3)
Chloride: 101 mmol/L (ref 98–111)
Creatinine, Ser: 0.86 mg/dL (ref 0.44–1.00)
GFR, Estimated: >60 mL/min (ref >60)
Glucose, Bld: 104 mg/dL — ABNORMAL HIGH (ref 70–99)
Potassium: 3.7 mmol/L (ref 3.5–5.1)
Sodium: 134 mmol/L — ABNORMAL LOW (ref 135–145)
Total Bilirubin: 0.8 mg/dL (ref 0.0–1.2)
Total Protein: 7.9 g/dL (ref 6.5–8.1)

## 2023-09-13 LAB — T4, FREE: Free T4: 1.45 ng/dL — ABNORMAL HIGH (ref 0.61–1.12)

## 2023-09-13 LAB — URINALYSIS, W/ REFLEX TO CULTURE (INFECTION SUSPECTED)
Bacteria, UA: NONE SEEN
Bilirubin Urine: NEGATIVE
Glucose, UA: NEGATIVE mg/dL
Ketones, ur: NEGATIVE mg/dL
Leukocytes,Ua: NEGATIVE
Nitrite: NEGATIVE
Protein, ur: NEGATIVE mg/dL
Specific Gravity, Urine: 1.003 — ABNORMAL LOW (ref 1.005–1.030)
pH: 8 (ref 5.0–8.0)

## 2023-09-13 LAB — SAMPLE TO BLOOD BANK

## 2023-09-13 LAB — VITAMIN B12: Vitamin B-12: 757 pg/mL (ref 180–914)

## 2023-09-13 LAB — POC OCCULT BLOOD, ED: Fecal Occult Bld: NEGATIVE

## 2023-09-13 LAB — IRON AND TIBC
Iron: 15 ug/dL — ABNORMAL LOW (ref 28–170)
Saturation Ratios: 10 % — ABNORMAL LOW (ref 10.4–31.8)
TIBC: 158 ug/dL — ABNORMAL LOW (ref 250–450)
UIBC: 143 ug/dL

## 2023-09-13 LAB — FERRITIN: Ferritin: 290 ng/mL (ref 11–307)

## 2023-09-13 LAB — I-STAT CG4 LACTIC ACID, ED: Lactic Acid, Venous: 1.4 mmol/L (ref 0.5–1.9)

## 2023-09-13 LAB — TSH: TSH: 1.649 u[IU]/mL (ref 0.350–4.500)

## 2023-09-13 LAB — FOLATE: Folate: 17 ng/mL (ref >5.9)

## 2023-09-13 MED ORDER — FERROUS SULFATE 325 (65 FE) MG PO TABS: 325.0000 mg | ORAL_TABLET | Freq: Every day | ORAL | 0 refills | Status: DC

## 2023-09-13 MED ORDER — LACTATED RINGERS IV BOLUS
1000.0000 mL | Freq: Once | INTRAVENOUS | Status: AC
  Administered 2023-09-13: 1000 mL via INTRAVENOUS

## 2023-09-13 NOTE — ED Provider Notes (Signed)
 [place] EMERGENCY DEPARTMENT AT [facility] Provider Note   CSN: [id number] Arrival date & time: 09/13/23  1633     History {Add pertinent medical, surgical, social history, OB history to HPI:1} Chief Complaint  Patient presents with   Weight Loss   Tachycardia    Kristin [name] is a 58 y.o. female.  HPI   Pt has been having trouble with weight loss since July.  Pt states recently though she started to have fever up to 102.  No vomiting or diarrhea.  No cough.  No shortness of breath.  Pt has been seeing her doctor for the weight loss.  They do not know the source yet.  No blood in he stool.  No abd pain.  No dark stools  Home Medications Prior to Admission medications   Medication Sig Start Date End Date Taking? Authorizing Provider  albuterol  (VENTOLIN  HFA) 108 (90 Base) MCG/ACT inhaler Inhale 1-2 puffs into the lungs every 6 (six) hours as needed for wheezing or shortness of breath. 09/21/21  Yes Corbin Dess, PA-C  atorvastatin (LIPITOR) 20 MG tablet Take 20 mg by mouth daily.   Yes [provider]  Lifitegrast [place]ouch) 5 % SOLN Place 1 drop into both eyes 2 (two) times daily.   Yes [provider]  Multiple Vitamins-Calcium (ONE-A-DAY WOMENS PO) Take 1 tablet by mouth every morning.   Yes [provider]  Polyethyl Glycol-Propyl Glycol (SYSTANE OP) Apply 1 drop to eye 2 (two) times daily.   Yes [provider]  cetirizine  (ZYRTEC  ALLERGY) 10 MG tablet Take 1 tablet (10 mg total) by mouth daily. Patient not taking: Reported on 09/13/2023 09/21/21   Corbin Dess, PA-C  etodolac  (LODINE ) 400 MG tablet Take 1 tablet (400 mg total) by mouth 2 (two) times daily. Patient not taking: Reported on 09/13/2023 09/24/22   Lucina Sabal, PA-C  fluticasone  (FLONASE ) 50 MCG/ACT nasal spray Place 1 spray into both nostrils 2 (two) times daily. Patient not taking: Reported on 09/13/2023 09/21/21   Corbin Dess, PA-C   methocarbamol  (ROBAXIN ) 500 MG tablet Take 1 tablet (500 mg total) by mouth 2 (two) times daily. Patient not taking: Reported on 09/13/2023 09/24/22   Lucina Sabal, PA-C  predniSONE  (DELTASONE ) 20 MG tablet Take 2 tablets (40 mg total) by mouth daily with breakfast. Patient not taking: Reported on 09/13/2023 09/21/21   Corbin Dess, PA-C  promethazine -dextromethorphan (PROMETHAZINE -DM) 6.25-15 MG/5ML syrup Take 5 mLs by mouth 4 (four) times daily as needed for cough. Patient not taking: Reported on 09/13/2023 09/21/21   Corbin Dess, PA-C      Allergies    Patient has no known allergies.    Review of Systems   Review of Systems  Physical Exam Updated Vital Signs BP 130/68 (BP Location: Left Arm)   Pulse 98   Temp 98.1 F (36.7 C) (Oral)   Resp 20   Ht 1.626 m (5\' 4" )   Wt 78.9 kg   SpO2 100%   BMI 29.87 kg/m  Physical Exam Vitals and nursing note reviewed.  Constitutional:      General: She is not in acute distress.    Appearance: She is well-developed.  HENT:     Head: Normocephalic and atraumatic.     Right Ear: External ear normal.     Left Ear: External ear normal.  Eyes:     General: No scleral icterus.       Right eye: No discharge.  Left eye: No discharge.     Conjunctiva/sclera: Conjunctivae normal.  Neck:     Trachea: No tracheal deviation.  Cardiovascular:     Rate and Rhythm: Regular rhythm. Tachycardia present.  Pulmonary:     Effort: Pulmonary effort is normal. No respiratory distress.     Breath sounds: Normal breath sounds. No stridor. No wheezing or rales.  Abdominal:     General: Bowel sounds are normal. There is no distension.     Palpations: Abdomen is soft.     Tenderness: There is no abdominal tenderness. There is no guarding or rebound.  Musculoskeletal:        General: No tenderness or deformity.     Cervical back: Neck supple.  Skin:    General: Skin is warm and dry.     Findings: No rash.  Neurological:      General: No focal deficit present.     Mental Status: She is alert.     Cranial Nerves: No cranial nerve deficit, dysarthria or facial asymmetry.     Sensory: No sensory deficit.     Motor: No abnormal muscle tone or seizure activity.     Coordination: Coordination normal.  Psychiatric:        Mood and Affect: Mood normal.     ED Results / Procedures / Treatments   Labs (all labs ordered are listed, but only abnormal results are displayed) Labs Reviewed  COMPREHENSIVE METABOLIC PANEL WITH GFR - Abnormal; Notable for the following components:      Result Value   Sodium 134 (*)    Glucose, Bld 104 (*)    Calcium 8.5 (*)    Albumin 2.4 (*)    All other components within normal limits  CBC WITH DIFFERENTIAL/PLATELET - Abnormal; Notable for the following components:   RBC 3.74 (*)    Hemoglobin 8.6 (*)    HCT 27.8 (*)    MCV 74.3 (*)    MCH 23.0 (*)    RDW 17.4 (*)    Platelets 676 (*)    All other components within normal limits  URINALYSIS, W/ REFLEX TO CULTURE (INFECTION SUSPECTED) - Abnormal; Notable for the following components:   Color, Urine STRAW (*)    Specific Gravity, Urine 1.003 (*)    Hgb urine dipstick SMALL (*)    All other components within normal limits  CULTURE, BLOOD (ROUTINE X 2)  CULTURE, BLOOD (ROUTINE X 2)  TSH  T4, FREE  I-STAT CG4 LACTIC ACID, ED  I-STAT CG4 LACTIC ACID, ED  POC OCCULT BLOOD, ED  SAMPLE TO BLOOD BANK    EKG EKG Interpretation Date/Time:  Tuesday Sep 13 2023 16:43:20 EDT Ventricular Rate:  127 PR Interval:  112 QRS Duration:  81 QT Interval:  283 QTC Calculation: 412 R Axis:   70  Text Interpretation: Sinus tachycardia Consider right atrial enlargement Since last tracing rate faster Confirmed by Trish Furl [phone]) on 09/13/2023 4:46:45 PM  Radiology DG Chest 2 View Result Date: 09/13/2023 CLINICAL DATA:  Septic workup with unintentional weight loss. EXAM: CHEST - 2 VIEW COMPARISON:  Sep 24, 2022 FINDINGS: The heart size  and mediastinal contours are within normal limits. A 4 mm nodular opacity is seen overlying the lateral aspect of the right upper lobe. There is no evidence of an acute infiltrate, pleural effusion or pneumothorax. The visualized skeletal structures are unremarkable. IMPRESSION: 4 mm nodular opacity overlying the lateral aspect of the right upper lobe. Correlation with nonemergent chest CT is recommended to  exclude the presence of a pulmonary nodule. Electronically Signed   By: Virgle Grime M.D.   On: 09/13/2023 19:33    Procedures Procedures  {Document cardiac monitor, telemetry assessment procedure when appropriate:1}  Medications Ordered in ED Medications  lactated ringers  bolus 1,000 mL (0 mLs Intravenous Stopped 09/13/23 2046)    ED Course/ Medical Decision Making/ A&P Clinical Course as of 09/13/23 2141  Tue Sep 13, 2023  1930 CBC with Differential(!) Anemia noted [JK]  1930 Comprehensive metabolic panel(!) nl [JK]  1930 Urinalysis, w/ Reflex to Culture (Infection Suspected) -Urine, Clean Catch(!) nl [JK]  1932 I-Stat Lactic Acid, ED nl [JK]  2132 TSH normal. [JK]  2132 Equal occult negative. [JK]  2132 Chest x-ray without pneumonia.  Possible pulmonary nodule [JK]    Clinical Course User Index [JK] Trish Furl, MD   {   Click here for ABCD2, HEART and other calculatorsREFRESH Note before signing :1}                              Medical Decision Making Amount and/or Complexity of Data Reviewed Labs: ordered. Decision-making details documented in ED Course. Radiology: ordered.  Risk OTC drugs.   Patient presented to the ED for evaluation of tachycardia fever.  Presentation concerning for the possibility of sepsis.  Patient's ED workup overall reassuring.  No leukocytosis.  No lactic acidosis.  Patient's TSH is normal.  Chest x-ray does not show evidence of pneumonia but there is possible nodule.  Patient will need outpatient follow-up.  Was anemic.  She is  Hemoccult negative.  However with her weight loss issues and anemia I do think she would benefit from further outpatient evaluation possible colonoscopy.  I discussed the findings and plans with the patient.  Will have her follow-up with PCP and GI for further evaluation.  {Document critical care time when appropriate:1} {Document review of labs and clinical decision tools ie heart score, Chads2Vasc2 etc:1}  {Document your independent review of radiology images, and any outside records:1} {Document your discussion with family members, caretakers, and with consultants:1} {Document social determinants of health affecting pt's care:1} {Document your decision making why or why not admission, treatments were needed:1} Final Clinical Impression(s) / ED Diagnoses Final diagnoses:  Pulmonary nodule  Anemia, unspecified type    Rx / DC Orders ED Discharge Orders     None

## 2023-09-13 NOTE — ED Triage Notes (Signed)
 Pt saw PCP today and HR was 112 and temperature was 102. Pt sent here by PCP for septic workup. Pt reports unintentional weight loss over the last year.  Denies pain or any symptoms

## 2023-09-13 NOTE — Discharge Instructions (Addendum)
 The test today did not show any signs of bloodstream infection.  However, your test did show you are anemic.  Follow-up with Dr. Nickey Barn to discuss further evaluation.  Follow-up with your primary care doctor to be rechecked.  X-ray did show an incidental nodule in the lung.  The radiologist recommended an outpatient CT to have that rechecked.  Follow-up with your primary care doctor to schedule a routine outpatient CAT scan.

## 2023-09-13 NOTE — ED Notes (Signed)
 Pt denies chest pain/sob at this time. Tried getting urine sample, could not obtain. Walked to restroom without incident, had steady gait

## 2023-09-13 NOTE — ED Notes (Signed)
 Patient is resting comfortably.

## 2023-09-18 LAB — CULTURE, BLOOD (ROUTINE X 2): Culture: NO GROWTH

## 2023-09-19 DIAGNOSIS — D509 Iron deficiency anemia, unspecified: Secondary | ICD-10-CM | POA: Diagnosis not present

## 2023-09-19 DIAGNOSIS — R634 Abnormal weight loss: Secondary | ICD-10-CM | POA: Diagnosis not present

## 2023-09-19 DIAGNOSIS — K7689 Other specified diseases of liver: Secondary | ICD-10-CM | POA: Diagnosis not present

## 2023-09-21 ENCOUNTER — Telehealth: Payer: Self-pay | Admitting: *Deleted

## 2023-09-21 DIAGNOSIS — D259 Leiomyoma of uterus, unspecified: Secondary | ICD-10-CM | POA: Diagnosis not present

## 2023-09-21 NOTE — Telephone Encounter (Signed)
 PT has a referral for a Nodule. Called to make appt but she will call back. Offered her appt June 6 w/[facility]. Referral not in system yet.

## 2023-09-22 ENCOUNTER — Telehealth: Payer: Self-pay

## 2023-09-22 NOTE — Telephone Encounter (Signed)
 Spoke with the patient regarding the referral to GYN oncology. Patient scheduled as new patient with Dr Orvil Bland on 10/13/2023. Patient given an arrival time of 10:45am.  Explained to the patient the the doctor will perform a pelvic exam at this visit. Patient given the policy that only one visitor allowed and that visitor must be over 16 yrs are allowed in the Cancer Center. Patient given the address/phone number for the clinic and that the center offers free valet service. Patient aware that masks required.

## 2023-09-27 ENCOUNTER — Other Ambulatory Visit: Payer: Self-pay | Admitting: Physician Assistant

## 2023-09-27 ENCOUNTER — Encounter (HOSPITAL_COMMUNITY): Payer: Self-pay | Admitting: Physician Assistant

## 2023-09-27 DIAGNOSIS — H9311 Tinnitus, right ear: Secondary | ICD-10-CM

## 2023-10-03 ENCOUNTER — Telehealth: Payer: Self-pay

## 2023-10-03 NOTE — Telephone Encounter (Signed)
 Returned VM. Pt requesting to reschedule 10/11/2023 appt with Margit Shelling, NP to 10/14/2023. LVM, advises open times are 8:30 or 9am on 10/14/2023. Awaiting patient call back to reschedule appt.

## 2023-10-05 ENCOUNTER — Telehealth: Payer: Self-pay

## 2023-10-05 NOTE — Telephone Encounter (Signed)
 Spoke with patient and she declined wanting to move her appointment up from 6/12 to 6/6.Aaron Aas

## 2023-10-07 ENCOUNTER — Other Ambulatory Visit

## 2023-10-11 ENCOUNTER — Ambulatory Visit: Admitting: Acute Care

## 2023-10-12 ENCOUNTER — Encounter: Payer: Self-pay | Admitting: Gynecologic Oncology

## 2023-10-12 NOTE — H&P (View-Only) (Signed)
 GYNECOLOGIC ONCOLOGY NEW PATIENT CONSULTATION   Patient Name: Kristin [name]  Patient Age: 58 y.o. Date of Service: 10/13/23 Referring Provider: Dyanna Glasgow, DO  Primary Care Provider: Chapman Commodore, MD Consulting Provider: Wiley Hanger, MD   Assessment/Plan:  Postmenopausal patient with enlarging uterine mass and findings that raise the suspicion for possible uterine malignancy.  I spent some time reviewing recent history, lab test, and imaging with the patient and her family.  She has a known history of uterine fibroids that had last been imaged on CT scan in 2018.  We looked at the images at that time compared to now.  There has been increased size in the fundal uterine mass over the last 7 years and notably cystic changes centrally consistent with degeneration.  Typically, in the setting of menopause, uterine fibroids shrink.  While it is certainly possible that this is a benign fibroid that has degenerated and bled into itself, imaging findings and growth in size raise the concern for a uterine malignancy, most likely a sarcoma.  I also reviewed that recent chest x-ray showed a questionable small pulmonary nodule.  This requires further workup in the setting of her other symptoms and pelvic imaging findings.  CT of the chest was ordered today.  I requested that this be done using a CT angio protocol given her tachycardia, to rule out pulmonary embolism.  She denies any symptoms such as shortness of breath but has been documented as tachycardic on her last several encounters with the healthcare system.  We also discussed the increase in size of multiple liver cysts.  While these are favored to be benign by radiology, depending on CT chest findings, these may require additional workup such as with an abdominal MRI or PET scan.  Given very likely need for surgery in the coming weeks, I suggested that we repeat a CBC to see if her anemia has improved at all since she started oral iron  repletion.  Her tachycardia and fatigue/weakness may be explained simply by her anemia.  If she remains persistently anemic to the degree that she was last month, I have spoken with Dr. Marton Sleeper about seeing her today to discuss more urgent interventions such as IV iron to improve her anemia in preparation for upcoming surgery.  With regard to surgery, this would entail total hysterectomy with bilateral salpingo-oophorectomy.  Plan would be to start laparoscopically to assess if surgery could be done robotically or require laparotomy.  Given the size of the fundal uterine mass, at least a mini laparotomy will be necessary for specimen removal.  The mass would be sent for frozen section and additional procedures such as debulking would be dictated by frozen section.  We will tentatively plan on at least an overnight stay given need for a larger abdominal incision for mass removal.  I will call the patient with chest CT results.  If at that time, we need to obtain additional imaging or there is anything else required prior to surgery, we will discuss at that time.  Otherwise, I will review the surgical risks with her (we reviewed only the procedure itself today as well as postoperative expectations).  She was given a copy of perioperative educational material.  We will plan to have one of the office staff review this with her by phone as well closer to the date of surgery after my upcoming phone discussion with her.  She canceled/postponed her upcoming EGD and colonoscopy.  I think this is reasonable given what we discussed  today.  In the setting of no blood in her stool as well as negative FOBT, I think it is unlikely that the source of her symptomatic anemia is related to GI losses.   A copy of this note was sent to the patient's referring provider.   70 minutes of total time was spent for this patient encounter, including preparation, face-to-face counseling with the patient and coordination of care, and  documentation of the encounter.  Wiley Hanger, MD  Division of Gynecologic Oncology  Department of Obstetrics and Gynecology  [facility]  ___________________________________________  Chief Complaint: Chief Complaint  Patient presents with   pelvic mass    History of Present Illness:  Kristin [name] is a 58 y.o. y.o. female who is seen in consultation at the request of Dyanna Glasgow, DO for an evaluation of enlarging uterine mass.  Patient initially experienced lower abdominal fullness and discomfort.  She saw gastroenterology and for right due to symptoms and weight loss.  Imaging of her abdomen was ordered. 07/14/23: CT of the abdomen and pelvis shows a large pelvic mass measuring 14.7 x 14.7 x 12 cm, suspected to be a degenerated/liquid fundal fibroid.  On prior CT imaging it measured up to 8.6 cm.  Difficult to identify endometrium or ovaries.  Prominent parametrial vasculature.  No ascites or adenopathy.  Mild hepatic cysts show interval enlargement since prior CT scan. She was seen in the emergency department for weight loss in mid May as well as recent fever up to 102.  Was noted to be tachycardic.  Workup was overall reassuring with no leukocytosis, normal lactate as well as TSH. Chest x-ray at that visit showed a 4 mm nodular opacity overlying the lateral aspect of the right upper lobe, CT recommended.  Today, the patient, the patient presents with her husband and son.  She notes overall feeling weak and fatigued.  She has been taking oral iron since she was found to have iron deficiency anemia at her recent emergency department visit.  Sometimes feels her heart rate racing, most of the time does not.  Has had some hot flashes since menopause, denies any recent changes.  Did not feel febrile when she was sent to the emergency department but had been noted to have a temperature at her primary care provider's office.  She weighed over 190 last July.   Denies any recent changes in what she is eating or the quantity.  Endorses a good appetite.  Denies any nausea or emesis.  Reports baseline bowel bladder function.  She denies any postmenopausal bleeding.  Denies shortness of breath or chest pain.  Has some episodes where she feels her heart beating quickly, frequently is asymptomatic.  She had been scheduled for colonoscopy and EGD but canceled these given her concern about stopping iron and oral intake because of how weak she has been feeling.  PAST MEDICAL HISTORY:  Past Medical History:  Diagnosis Date   Arthritis    knee, back; improved with weight loss   Asthma    Dry eyes    Hyperlipidemia    Uterine fibroid      PAST SURGICAL HISTORY:  Past Surgical History:  Procedure Laterality Date   COLONOSCOPY     DILATATION & CURETTAGE/HYSTEROSCOPY WITH MYOSURE N/A 04/29/2016   Procedure: DILATATION & CURETTAGE/HYSTEROSCOPY WITH MYOSURE;  Surgeon: Ivery Marking, MD;  Location: [facility];  Service: Gynecology;  Laterality: N/A;   TUBAL LIGATION     UPPER GI ENDOSCOPY  OB/GYN HISTORY:  OB History  Gravida Para Term Preterm AB Living  4 4      SAB IAB Ectopic Multiple Live Births          # Outcome Date GA Lbr Len/2nd Weight Sex Type Anes PTL Lv  4 Para           3 Para           2 Para           1 Para             No LMP recorded. Patient is postmenopausal.  Age at menarche: 81  Age at menopause: 63 Hx of HRT: denies Hx of STDs: denies Last pap: 08/2023 History of abnormal pap smears: denies  SCREENING STUDIES:  Last mammogram: 2025  Last colonoscopy: 2022  MEDICATIONS: Outpatient Encounter Medications as of 10/13/2023  Medication Sig   albuterol  (VENTOLIN  HFA) 108 (90 Base) MCG/ACT inhaler Inhale 1-2 puffs into the lungs every 6 (six) hours as needed for wheezing or shortness of breath.   atorvastatin (LIPITOR) 20 MG tablet Take 20 mg by mouth daily.   ferrous sulfate  325 (65 FE) MG tablet Take 1 tablet (325  mg total) by mouth daily.   Lifitegrast (XIIDRA) 5 % SOLN Place 1 drop into both eyes 2 (two) times daily.   Multiple Vitamins-Calcium (ONE-A-DAY WOMENS PO) Take 1 tablet by mouth every morning.   Polyethyl Glycol-Propyl Glycol (SYSTANE OP) Apply 1 drop to eye 2 (two) times daily.   senna-docusate (SENOKOT-S) 8.6-50 MG tablet Take 2 tablets by mouth at bedtime. For AFTER surgery, do not take if having diarrhea   traMADol (ULTRAM) 50 MG tablet Take 1 tablet (50 mg total) by mouth every 6 (six) hours as needed for severe pain (pain score 7-10). For AFTER surgery only, do not take and drive   fluticasone  (FLONASE ) 50 MCG/ACT nasal spray Place 1 spray into both nostrils 2 (two) times daily. (Patient not taking: Reported on 09/13/2023)   [DISCONTINUED] cetirizine  (ZYRTEC  ALLERGY) 10 MG tablet Take 1 tablet (10 mg total) by mouth daily. (Patient not taking: Reported on 09/13/2023)   [DISCONTINUED] etodolac  (LODINE ) 400 MG tablet Take 1 tablet (400 mg total) by mouth 2 (two) times daily. (Patient not taking: Reported on 09/13/2023)   [DISCONTINUED] methocarbamol  (ROBAXIN ) 500 MG tablet Take 1 tablet (500 mg total) by mouth 2 (two) times daily. (Patient not taking: Reported on 09/13/2023)   [DISCONTINUED] predniSONE  (DELTASONE ) 20 MG tablet Take 2 tablets (40 mg total) by mouth daily with breakfast. (Patient not taking: Reported on 09/13/2023)   [DISCONTINUED] promethazine -dextromethorphan (PROMETHAZINE -DM) 6.25-15 MG/5ML syrup Take 5 mLs by mouth 4 (four) times daily as needed for cough. (Patient not taking: Reported on 09/13/2023)   No facility-administered encounter medications on file as of 10/13/2023.    ALLERGIES:  No Known Allergies   FAMILY HISTORY:  Family History  Problem Relation Age of Onset   Breast cancer Neg Hx    Colon cancer Neg Hx    Pancreatic cancer Neg Hx    Prostate cancer Neg Hx    Ovarian cancer Neg Hx    Endometrial cancer Neg Hx      SOCIAL HISTORY:  Social Connections: Not  on file    REVIEW OF SYSTEMS:  Unintentional weight loss. Denies appetite changes, fevers, chills, fatigue. Denies hearing loss, neck lumps or masses, mouth sores, ringing in ears or voice changes. Denies cough or wheezing.  Denies shortness of breath. Denies chest  pain or palpitations. Denies leg swelling. Denies abdominal distention, pain, blood in stools, constipation, diarrhea, nausea, vomiting, or early satiety. Denies pain with intercourse, dysuria, frequency, hematuria or incontinence. Denies hot flashes, pelvic pain, vaginal bleeding or vaginal discharge.   Denies joint pain, back pain or muscle pain/cramps. Denies itching, rash, or wounds. Denies dizziness, headaches, numbness or seizures. Denies swollen lymph nodes or glands, denies easy bruising or bleeding. Denies anxiety, depression, confusion, or decreased concentration.  Physical Exam:  Vital Signs for this encounter:  Blood pressure 136/72, pulse (!) 112, temperature 98 F (36.7 C), temperature source Oral, resp. rate 19, weight 169 lb 9.6 oz (76.9 kg), SpO2 99%. Body mass index is 29.11 kg/m. General: Alert, oriented, no acute distress.  HEENT: Normocephalic, atraumatic. Sclera anicteric.  Chest: Clear to auscultation bilaterally. No wheezes, rhonchi, or rales. Cardiovascular: Regular rate and rhythm, no murmurs, rubs, or gallops.  Abdomen: Normoactive bowel sounds. Soft, nondistended, nontender to palpation.  Fullness appreciated in the lower abdomen up to the umbilicus, mobile. No palpable fluid wave.  Extremities: Grossly normal range of motion. Warm, well perfused. No edema bilaterally.  Skin: No rashes or lesions.  Lymphatics: No cervical, supraclavicular, or inguinal adenopathy.  GU:  Normal external female genitalia. No lesions. No discharge or bleeding.             Bladder/urethra:  No lesions or masses, well supported bladder             Vagina: Well-rugated, no lesions.             Cervix: Normal  appearing, somewhat hyperemic, no lesions.             Uterus: Enlarged.  Entire uterus and mass move together, moderately mobile.  Mass spans up to the level of the umbilicus, some movement side-to-side.             Adnexa: See above.  Rectal: Deferred.  LABORATORY AND RADIOLOGIC DATA:  Outside medical records were reviewed to synthesize the above history, along with the history and physical obtained during the visit.   Lab Results  Component Value Date   WBC 6.3 09/13/2023   HGB 8.6 (L) 09/13/2023   HCT 27.8 (L) 09/13/2023   PLT 676 (H) 09/13/2023   GLUCOSE 104 (H) 09/13/2023   ALT 12 09/13/2023   AST 17 09/13/2023   NA 134 (L) 09/13/2023   K 3.7 09/13/2023   CL 101 09/13/2023   CREATININE 0.86 09/13/2023   BUN 12 09/13/2023   CO2 23 09/13/2023   TSH 1.649 09/13/2023

## 2023-10-12 NOTE — Progress Notes (Signed)
 GYNECOLOGIC ONCOLOGY NEW PATIENT CONSULTATION   Patient Name: Kristin [name]  Patient Age: 58 y.o. Date of Service: 10/13/23 Referring Provider: Dyanna Glasgow, DO  Primary Care Provider: Chapman Commodore, MD Consulting Provider: Wiley Hanger, MD   Assessment/Plan:  Postmenopausal patient with enlarging uterine mass and findings that raise the suspicion for possible uterine malignancy.  I spent some time reviewing recent history, lab test, and imaging with the patient and her family.  She has a known history of uterine fibroids that had last been imaged on CT scan in 2018.  We looked at the images at that time compared to now.  There has been increased size in the fundal uterine mass over the last 7 years and notably cystic changes centrally consistent with degeneration.  Typically, in the setting of menopause, uterine fibroids shrink.  While it is certainly possible that this is a benign fibroid that has degenerated and bled into itself, imaging findings and growth in size raise the concern for a uterine malignancy, most likely a sarcoma.  I also reviewed that recent chest x-ray showed a questionable small pulmonary nodule.  This requires further workup in the setting of her other symptoms and pelvic imaging findings.  CT of the chest was ordered today.  I requested that this be done using a CT angio protocol given her tachycardia, to rule out pulmonary embolism.  She denies any symptoms such as shortness of breath but has been documented as tachycardic on her last several encounters with the healthcare system.  We also discussed the increase in size of multiple liver cysts.  While these are favored to be benign by radiology, depending on CT chest findings, these may require additional workup such as with an abdominal MRI or PET scan.  Given very likely need for surgery in the coming weeks, I suggested that we repeat a CBC to see if her anemia has improved at all since she started oral iron  repletion.  Her tachycardia and fatigue/weakness may be explained simply by her anemia.  If she remains persistently anemic to the degree that she was last month, I have spoken with Dr. Marton Sleeper about seeing her today to discuss more urgent interventions such as IV iron to improve her anemia in preparation for upcoming surgery.  With regard to surgery, this would entail total hysterectomy with bilateral salpingo-oophorectomy.  Plan would be to start laparoscopically to assess if surgery could be done robotically or require laparotomy.  Given the size of the fundal uterine mass, at least a mini laparotomy will be necessary for specimen removal.  The mass would be sent for frozen section and additional procedures such as debulking would be dictated by frozen section.  We will tentatively plan on at least an overnight stay given need for a larger abdominal incision for mass removal.  I will call the patient with chest CT results.  If at that time, we need to obtain additional imaging or there is anything else required prior to surgery, we will discuss at that time.  Otherwise, I will review the surgical risks with her (we reviewed only the procedure itself today as well as postoperative expectations).  She was given a copy of perioperative educational material.  We will plan to have one of the office staff review this with her by phone as well closer to the date of surgery after my upcoming phone discussion with her.  She canceled/postponed her upcoming EGD and colonoscopy.  I think this is reasonable given what we discussed  today.  In the setting of no blood in her stool as well as negative FOBT, I think it is unlikely that the source of her symptomatic anemia is related to GI losses.   A copy of this note was sent to the patient's referring provider.   70 minutes of total time was spent for this patient encounter, including preparation, face-to-face counseling with the patient and coordination of care, and  documentation of the encounter.  Wiley Hanger, MD  Division of Gynecologic Oncology  Department of Obstetrics and Gynecology  [facility]  ___________________________________________  Chief Complaint: Chief Complaint  Patient presents with   pelvic mass    History of Present Illness:  Kristin [name] is a 58 y.o. y.o. female who is seen in consultation at the request of Dyanna Glasgow, DO for an evaluation of enlarging uterine mass.  Patient initially experienced lower abdominal fullness and discomfort.  She saw gastroenterology and for right due to symptoms and weight loss.  Imaging of her abdomen was ordered. 07/14/23: CT of the abdomen and pelvis shows a large pelvic mass measuring 14.7 x 14.7 x 12 cm, suspected to be a degenerated/liquid fundal fibroid.  On prior CT imaging it measured up to 8.6 cm.  Difficult to identify endometrium or ovaries.  Prominent parametrial vasculature.  No ascites or adenopathy.  Mild hepatic cysts show interval enlargement since prior CT scan. She was seen in the emergency department for weight loss in mid May as well as recent fever up to 102.  Was noted to be tachycardic.  Workup was overall reassuring with no leukocytosis, normal lactate as well as TSH. Chest x-ray at that visit showed a 4 mm nodular opacity overlying the lateral aspect of the right upper lobe, CT recommended.  Today, the patient, the patient presents with her husband and son.  She notes overall feeling weak and fatigued.  She has been taking oral iron since she was found to have iron deficiency anemia at her recent emergency department visit.  Sometimes feels her heart rate racing, most of the time does not.  Has had some hot flashes since menopause, denies any recent changes.  Did not feel febrile when she was sent to the emergency department but had been noted to have a temperature at her primary care provider's office.  She weighed over 190 last July.   Denies any recent changes in what she is eating or the quantity.  Endorses a good appetite.  Denies any nausea or emesis.  Reports baseline bowel bladder function.  She denies any postmenopausal bleeding.  Denies shortness of breath or chest pain.  Has some episodes where she feels her heart beating quickly, frequently is asymptomatic.  She had been scheduled for colonoscopy and EGD but canceled these given her concern about stopping iron and oral intake because of how weak she has been feeling.  PAST MEDICAL HISTORY:  Past Medical History:  Diagnosis Date   Arthritis    knee, back; improved with weight loss   Asthma    Dry eyes    Hyperlipidemia    Uterine fibroid      PAST SURGICAL HISTORY:  Past Surgical History:  Procedure Laterality Date   COLONOSCOPY     DILATATION & CURETTAGE/HYSTEROSCOPY WITH MYOSURE N/A 04/29/2016   Procedure: DILATATION & CURETTAGE/HYSTEROSCOPY WITH MYOSURE;  Surgeon: Ivery Marking, MD;  Location: [facility];  Service: Gynecology;  Laterality: N/A;   TUBAL LIGATION     UPPER GI ENDOSCOPY  OB/GYN HISTORY:  OB History  Gravida Para Term Preterm AB Living  4 4      SAB IAB Ectopic Multiple Live Births          # Outcome Date GA Lbr Len/2nd Weight Sex Type Anes PTL Lv  4 Para           3 Para           2 Para           1 Para             No LMP recorded. Patient is postmenopausal.  Age at menarche: 81  Age at menopause: 63 Hx of HRT: denies Hx of STDs: denies Last pap: 08/2023 History of abnormal pap smears: denies  SCREENING STUDIES:  Last mammogram: 2025  Last colonoscopy: 2022  MEDICATIONS: Outpatient Encounter Medications as of 10/13/2023  Medication Sig   albuterol  (VENTOLIN  HFA) 108 (90 Base) MCG/ACT inhaler Inhale 1-2 puffs into the lungs every 6 (six) hours as needed for wheezing or shortness of breath.   atorvastatin (LIPITOR) 20 MG tablet Take 20 mg by mouth daily.   ferrous sulfate  325 (65 FE) MG tablet Take 1 tablet (325  mg total) by mouth daily.   Lifitegrast (XIIDRA) 5 % SOLN Place 1 drop into both eyes 2 (two) times daily.   Multiple Vitamins-Calcium (ONE-A-DAY WOMENS PO) Take 1 tablet by mouth every morning.   Polyethyl Glycol-Propyl Glycol (SYSTANE OP) Apply 1 drop to eye 2 (two) times daily.   senna-docusate (SENOKOT-S) 8.6-50 MG tablet Take 2 tablets by mouth at bedtime. For AFTER surgery, do not take if having diarrhea   traMADol (ULTRAM) 50 MG tablet Take 1 tablet (50 mg total) by mouth every 6 (six) hours as needed for severe pain (pain score 7-10). For AFTER surgery only, do not take and drive   fluticasone  (FLONASE ) 50 MCG/ACT nasal spray Place 1 spray into both nostrils 2 (two) times daily. (Patient not taking: Reported on 09/13/2023)   [DISCONTINUED] cetirizine  (ZYRTEC  ALLERGY) 10 MG tablet Take 1 tablet (10 mg total) by mouth daily. (Patient not taking: Reported on 09/13/2023)   [DISCONTINUED] etodolac  (LODINE ) 400 MG tablet Take 1 tablet (400 mg total) by mouth 2 (two) times daily. (Patient not taking: Reported on 09/13/2023)   [DISCONTINUED] methocarbamol  (ROBAXIN ) 500 MG tablet Take 1 tablet (500 mg total) by mouth 2 (two) times daily. (Patient not taking: Reported on 09/13/2023)   [DISCONTINUED] predniSONE  (DELTASONE ) 20 MG tablet Take 2 tablets (40 mg total) by mouth daily with breakfast. (Patient not taking: Reported on 09/13/2023)   [DISCONTINUED] promethazine -dextromethorphan (PROMETHAZINE -DM) 6.25-15 MG/5ML syrup Take 5 mLs by mouth 4 (four) times daily as needed for cough. (Patient not taking: Reported on 09/13/2023)   No facility-administered encounter medications on file as of 10/13/2023.    ALLERGIES:  No Known Allergies   FAMILY HISTORY:  Family History  Problem Relation Age of Onset   Breast cancer Neg Hx    Colon cancer Neg Hx    Pancreatic cancer Neg Hx    Prostate cancer Neg Hx    Ovarian cancer Neg Hx    Endometrial cancer Neg Hx      SOCIAL HISTORY:  Social Connections: Not  on file    REVIEW OF SYSTEMS:  Unintentional weight loss. Denies appetite changes, fevers, chills, fatigue. Denies hearing loss, neck lumps or masses, mouth sores, ringing in ears or voice changes. Denies cough or wheezing.  Denies shortness of breath. Denies chest  pain or palpitations. Denies leg swelling. Denies abdominal distention, pain, blood in stools, constipation, diarrhea, nausea, vomiting, or early satiety. Denies pain with intercourse, dysuria, frequency, hematuria or incontinence. Denies hot flashes, pelvic pain, vaginal bleeding or vaginal discharge.   Denies joint pain, back pain or muscle pain/cramps. Denies itching, rash, or wounds. Denies dizziness, headaches, numbness or seizures. Denies swollen lymph nodes or glands, denies easy bruising or bleeding. Denies anxiety, depression, confusion, or decreased concentration.  Physical Exam:  Vital Signs for this encounter:  Blood pressure 136/72, pulse (!) 112, temperature 98 F (36.7 C), temperature source Oral, resp. rate 19, weight 169 lb 9.6 oz (76.9 kg), SpO2 99%. Body mass index is 29.11 kg/m. General: Alert, oriented, no acute distress.  HEENT: Normocephalic, atraumatic. Sclera anicteric.  Chest: Clear to auscultation bilaterally. No wheezes, rhonchi, or rales. Cardiovascular: Regular rate and rhythm, no murmurs, rubs, or gallops.  Abdomen: Normoactive bowel sounds. Soft, nondistended, nontender to palpation.  Fullness appreciated in the lower abdomen up to the umbilicus, mobile. No palpable fluid wave.  Extremities: Grossly normal range of motion. Warm, well perfused. No edema bilaterally.  Skin: No rashes or lesions.  Lymphatics: No cervical, supraclavicular, or inguinal adenopathy.  GU:  Normal external female genitalia. No lesions. No discharge or bleeding.             Bladder/urethra:  No lesions or masses, well supported bladder             Vagina: Well-rugated, no lesions.             Cervix: Normal  appearing, somewhat hyperemic, no lesions.             Uterus: Enlarged.  Entire uterus and mass move together, moderately mobile.  Mass spans up to the level of the umbilicus, some movement side-to-side.             Adnexa: See above.  Rectal: Deferred.  LABORATORY AND RADIOLOGIC DATA:  Outside medical records were reviewed to synthesize the above history, along with the history and physical obtained during the visit.   Lab Results  Component Value Date   WBC 6.3 09/13/2023   HGB 8.6 (L) 09/13/2023   HCT 27.8 (L) 09/13/2023   PLT 676 (H) 09/13/2023   GLUCOSE 104 (H) 09/13/2023   ALT 12 09/13/2023   AST 17 09/13/2023   NA 134 (L) 09/13/2023   K 3.7 09/13/2023   CL 101 09/13/2023   CREATININE 0.86 09/13/2023   BUN 12 09/13/2023   CO2 23 09/13/2023   TSH 1.649 09/13/2023

## 2023-10-13 ENCOUNTER — Inpatient Hospital Stay

## 2023-10-13 ENCOUNTER — Encounter: Payer: Self-pay | Admitting: Gynecologic Oncology

## 2023-10-13 ENCOUNTER — Inpatient Hospital Stay: Admitting: Hematology and Oncology

## 2023-10-13 ENCOUNTER — Inpatient Hospital Stay: Attending: Gynecologic Oncology | Admitting: Gynecologic Oncology

## 2023-10-13 ENCOUNTER — Encounter: Payer: Self-pay | Admitting: Hematology and Oncology

## 2023-10-13 VITALS — HR 101 | Resp 18

## 2023-10-13 VITALS — BP 136/72 | HR 112 | Temp 98.0°F | Resp 19 | Wt 169.6 lb

## 2023-10-13 DIAGNOSIS — E611 Iron deficiency: Secondary | ICD-10-CM | POA: Insufficient documentation

## 2023-10-13 DIAGNOSIS — D509 Iron deficiency anemia, unspecified: Secondary | ICD-10-CM | POA: Diagnosis not present

## 2023-10-13 DIAGNOSIS — R634 Abnormal weight loss: Secondary | ICD-10-CM | POA: Diagnosis not present

## 2023-10-13 DIAGNOSIS — R000 Tachycardia, unspecified: Secondary | ICD-10-CM

## 2023-10-13 DIAGNOSIS — R1900 Intra-abdominal and pelvic swelling, mass and lump, unspecified site: Secondary | ICD-10-CM | POA: Diagnosis not present

## 2023-10-13 DIAGNOSIS — R918 Other nonspecific abnormal finding of lung field: Secondary | ICD-10-CM | POA: Diagnosis not present

## 2023-10-13 DIAGNOSIS — D259 Leiomyoma of uterus, unspecified: Secondary | ICD-10-CM | POA: Insufficient documentation

## 2023-10-13 DIAGNOSIS — D75838 Other thrombocytosis: Secondary | ICD-10-CM | POA: Insufficient documentation

## 2023-10-13 LAB — CBC (CANCER CENTER ONLY)
HCT: 26.3 % — ABNORMAL LOW (ref 36.0–46.0)
Hemoglobin: 7.9 g/dL — ABNORMAL LOW (ref 12.0–15.0)
MCH: 21.3 pg — ABNORMAL LOW (ref 26.0–34.0)
MCHC: 30 g/dL (ref 30.0–36.0)
MCV: 70.9 fL — ABNORMAL LOW (ref 80.0–100.0)
Platelet Count: 760 10*3/uL — ABNORMAL HIGH (ref 150–400)
RBC: 3.71 MIL/uL — ABNORMAL LOW (ref 3.87–5.11)
RDW: 19.5 % — ABNORMAL HIGH (ref 11.5–15.5)
WBC Count: 9.4 10*3/uL (ref 4.0–10.5)
nRBC: 0 % (ref 0.0–0.2)

## 2023-10-13 MED ORDER — SENNOSIDES-DOCUSATE SODIUM 8.6-50 MG PO TABS: 2.0000 | ORAL_TABLET | Freq: Every day | ORAL | 0 refills | Status: DC

## 2023-10-13 MED ORDER — TRAMADOL HCL 50 MG PO TABS: 50.0000 mg | ORAL_TABLET | Freq: Four times a day (QID) | ORAL | 0 refills | Status: DC | PRN

## 2023-10-13 NOTE — Patient Instructions (Addendum)
 Plan on having a phone visit with Dr. Orvil Bland after your CT scan to go over results and discuss recommendations. We will also have a preop phone call with you before surgery to discuss the below:  We are also going to check a CBC today. We will have you wait to see the results and let you know if we feel you need to be seen in the anemia clinic today before surgery to help build up your blood counts.   Preparing for your Surgery  Plan for surgery on 11/02/2023 with Dr. Wiley Hanger at [facility]. You will be scheduled for diagnostic laparoscopy (looking into the abdomen through a small incision with a camera), biopsies, possible robotic versus open total hysterectomy (removal of the uterus and cervix)/ bilateral salpingo-oophorectomy (removal of both ovaries and fallopian tubes)/ possible debulking. There is a chance surgery may be able to go sooner and we will call you if this is the case.    Plan on having a CT chest and you will be contacted with the results.   Pre-operative Testing -You will receive a phone call from presurgical testing at [facility] to arrange for a pre-operative appointment and lab work.  -Bring your insurance card, copy of an advanced directive if applicable, medication list  -At that visit, you will be asked to sign a consent for a possible blood transfusion in case a transfusion becomes necessary during surgery.  The need for a blood transfusion is rare but having consent is a necessary part of your care.     -You should not be taking blood thinners or aspirin at least ten days prior to surgery unless instructed by your surgeon.  -Do not take supplements such as fish oil (omega 3), red yeast rice, turmeric before your surgery. STOP TAKING AT LEAST 10 DAYS BEFORE SURGERY. You want to avoid medications with aspirin in them including headache powders such as BC or Goody's), Excedrin migraine.  Day Before Surgery at Home -You will be asked to take in a  light diet the day before surgery. You will be advised you can have clear liquids up until 3 hours before your surgery.    Eat a light diet the day before surgery.  Examples including soups, broths, toast, yogurt, mashed potatoes.  AVOID GAS PRODUCING FOODS AND BEVERAGES. Things to avoid include carbonated beverages (fizzy beverages, sodas), raw fruits and raw vegetables (uncooked), or beans.   If your bowels are filled with gas, your surgeon will have difficulty visualizing your pelvic organs which increases your surgical risks.  Your role in recovery Your role is to become active as soon as directed by your doctor, while still giving yourself time to heal.  Rest when you feel tired. You will be asked to do the following in order to speed your recovery:  - Cough and breathe deeply. This helps to clear and expand your lungs and can prevent pneumonia after surgery.  - STAY ACTIVE WHEN YOU GET HOME. Do mild physical activity. Walking or moving your legs help your circulation and body functions return to normal. Do not try to get up or walk alone the first time after surgery.   -If you develop swelling on one leg or the other, pain in the back of your leg, redness/warmth in one of your legs, please call the office or go to the Emergency Room to have a doppler to rule out a blood clot. For shortness of breath, chest pain-seek care in the Emergency Room  as soon as possible. - Actively manage your pain. Managing your pain lets you move in comfort. We will ask you to rate your pain on a scale of zero to 10. It is your responsibility to tell your doctor or nurse where and how much you hurt so your pain can be treated.  Special Considerations -If you are diabetic, you may be placed on insulin after surgery to have closer control over your blood sugars to promote healing and recovery.  This does not mean that you will be discharged on insulin.  If applicable, your oral antidiabetics will be resumed when you are  tolerating a solid diet.  -Your final pathology results from surgery should be available around one week after surgery and the results will be relayed to you when available.  -FMLA forms can be faxed to [phone] and please allow 5-7 business days for completion.  Pain Management After Surgery -You will be prescribed your pain medication and bowel regimen medications before surgery so that you can have these available when you are discharged from the hospital. The pain medication is for use ONLY AFTER surgery and a new prescription will not be given.   -Make sure that you have Tylenol and Ibuprofen  IF YOU ARE ABLE TO TAKE THESE MEDICATIONS at home to use on a regular basis after surgery for pain control. We recommend alternating the medications every hour to six hours since they work differently and are processed in the body differently for pain relief.  -Review the attached handout on narcotic use and their risks and side effects.   Bowel Regimen -You will be prescribed Sennakot-S to take nightly to prevent constipation especially if you are taking the narcotic pain medication intermittently.  It is important to prevent constipation and drink adequate amounts of liquids. You can stop taking this medication when you are not taking pain medication and you are back on your normal bowel routine.  Risks of Surgery Risks of surgery are low but include bleeding, infection, damage to surrounding structures, re-operation, blood clots, and very rarely death.   Blood Transfusion Information (For the consent to be signed before surgery)  We will be checking your blood type before surgery so in case of emergencies, we will know what type of blood you would need.                                            WHAT IS A BLOOD TRANSFUSION?  A transfusion is the replacement of blood or some of its parts. Blood is made up of multiple cells which provide different functions. Red blood cells carry oxygen and  are used for blood loss replacement. White blood cells fight against infection. Platelets control bleeding. Plasma helps clot blood. Other blood products are available for specialized needs, such as hemophilia or other clotting disorders. BEFORE THE TRANSFUSION  Who gives blood for transfusions?  You may be able to donate blood to be used at a later date on yourself (autologous donation). Relatives can be asked to donate blood. This is generally not any safer than if you have received blood from a stranger. The same precautions are taken to ensure safety when a relative's blood is donated. Healthy volunteers who are fully evaluated to make sure their blood is safe. This is blood bank blood. Transfusion therapy is the safest it has ever been in the practice of  medicine. Before blood is taken from a donor, a complete history is taken to make sure that person has no history of diseases nor engages in risky social behavior (examples are intravenous drug use or sexual activity with multiple partners). The donor's travel history is screened to minimize risk of transmitting infections, such as malaria. The donated blood is tested for signs of infectious diseases, such as HIV and hepatitis. The blood is then tested to be sure it is compatible with you in order to minimize the chance of a transfusion reaction. If you or a relative donates blood, this is often done in anticipation of surgery and is not appropriate for emergency situations. It takes many days to process the donated blood. RISKS AND COMPLICATIONS Although transfusion therapy is very safe and saves many lives, the main dangers of transfusion include:  Getting an infectious disease. Developing a transfusion reaction. This is an allergic reaction to something in the blood you were given. Every precaution is taken to prevent this. The decision to have a blood transfusion has been considered carefully by your caregiver before blood is given. Blood is  not given unless the benefits outweigh the risks.  AFTER SURGERY INSTRUCTIONS  Return to work: 4-6 weeks if applicable  Activity: 1. Be up and out of the bed during the day.  Take a nap if needed.  You may walk up steps but be careful and use the hand rail.  Stair climbing will tire you more than you think, you may need to stop part way and rest.   2. No lifting or straining for 6 weeks over 10 pounds. No pushing, pulling, straining for 6 weeks.  3. No driving for 1-61 days when the following criteria have been met: Do not drive if you are taking narcotic pain medicine and make sure that your reaction time has returned.   4. You can shower as soon as the next day after surgery. Shower daily.  Use your regular soap and water (not directly on the incision) and pat your incision(s) dry afterwards; don't rub.  No tub baths or submerging your body in water until cleared by your surgeon. If you have the soap that was given to you by pre-surgical testing that was used before surgery, you do not need to use it afterwards because this can irritate your incisions.   5. No sexual activity and nothing in the vagina for 12 weeks if you have a hysterectomy.  6. You may experience a small amount of clear drainage from your incisions, which is normal.  If the drainage persists, increases, or changes color please call the office.  7. Do not use creams, lotions, or ointments such as neosporin on your incisions after surgery until advised by your surgeon because they can cause removal of the dermabond glue on your incisions.    8. You may experience vaginal spotting after surgery or when the stitches at the top of the vagina begin to dissolve.  The spotting is normal but if you experience heavy bleeding, call our office.  9. Take Tylenol or ibuprofen  first for pain if you are able to take these medications and only use narcotic pain medication for severe pain not relieved by the Tylenol or Ibuprofen .  Monitor  your Tylenol intake to a max of 4,000 mg in a 24 hour period. You can alternate these medications after surgery.  Diet: 1. Low sodium Heart Healthy Diet is recommended but you are cleared to resume your normal (before surgery) diet  after your procedure.  2. It is safe to use a laxative, such as Miralax or Colace, if you have difficulty moving your bowels before surgery. You have been prescribed Sennakot-S to take at bedtime every evening after surgery to keep bowel movements regular and to prevent constipation.    Wound Care: 1. Keep clean and dry.  Shower daily.  Reasons to call the Doctor: Fever - Oral temperature greater than 100.4 degrees Fahrenheit Foul-smelling vaginal discharge Difficulty urinating Nausea and vomiting Increased pain at the site of the incision that is unrelieved with pain medicine. Difficulty breathing with or without chest pain New calf pain especially if only on one side Sudden, continuing increased vaginal bleeding with or without clots.   Contacts: For questions or concerns you should contact:  Dr. Wiley Hanger at [phone]  Vira Grieves, NP at [phone]  After Hours: call [phone] and have the GYN Oncologist paged/contacted (after 5 pm or on the weekends). You will speak with an after hours RN and let he or she know you have had surgery.  Messages sent via mychart are for non-urgent matters and are not responded to after hours so for urgent needs, please call the after hours number.

## 2023-10-13 NOTE — Assessment & Plan Note (Signed)
 I will defer management to GYN surgeon

## 2023-10-13 NOTE — Assessment & Plan Note (Signed)
 She has reactive thrombocytosis due to iron deficiency Observe closely I recommend the patient to increase oral fluid intake to minimize risk of thrombosis

## 2023-10-13 NOTE — Assessment & Plan Note (Signed)
 The most likely cause of her anemia is due to chronic blood loss/malabsorption syndrome. We discussed some of the risks, benefits, and alternatives of intravenous iron infusions. The patient is symptomatic from anemia and the iron level is critically low. She tolerated oral iron supplement poorly and desires to achieved higher levels of iron faster for adequate hematopoesis. Some of the side-effects to be expected including risks of infusion reactions, phlebitis, headaches, nausea and fatigue.  The patient is willing to proceed. Patient education material was dispensed.  Goal is to keep ferritin level greater than 50 and resolution of anemia We will order 2 doses of intravenous iron Feraheme She has appointment to follow-up with GYN oncology Typically, I will repeat labs again in 6 to 8 weeks to document adequate replacement I can schedule that by coordinating care with her GYN surgeon

## 2023-10-13 NOTE — Progress Notes (Signed)
 [facility] CONSULT NOTE  Patient Care Team: Chapman Commodore, MD as PCP - General (Cardiology)  ASSESSMENT & PLAN:  Iron deficiency anemia The most likely cause of her anemia is due to chronic blood loss/malabsorption syndrome. We discussed some of the risks, benefits, and alternatives of intravenous iron infusions. The patient is symptomatic from anemia and the iron level is critically low. She tolerated oral iron supplement poorly and desires to achieved higher levels of iron faster for adequate hematopoesis. Some of the side-effects to be expected including risks of infusion reactions, phlebitis, headaches, nausea and fatigue.  The patient is willing to proceed. Patient education material was dispensed.  Goal is to keep ferritin level greater than 50 and resolution of anemia We will order 2 doses of intravenous iron Feraheme She has appointment to follow-up with GYN oncology Typically, I will repeat labs again in 6 to 8 weeks to document adequate replacement I can schedule that by coordinating care with her GYN surgeon  Reactive thrombocytosis She has reactive thrombocytosis due to iron deficiency Observe closely I recommend the patient to increase oral fluid intake to minimize risk of thrombosis  Pelvic mass I will defer management to GYN surgeon No orders of the defined types were placed in this encounter.   All questions were answered. The patient knows to call the clinic with any problems, questions or concerns.  The total time spent in the appointment was 60 minutes encounter with patients including review of chart and various tests results, discussions about plan of care and coordination of care plan  Almeda Jacobs, MD 6/12/20253:04 PM   CHIEF COMPLAINTS/PURPOSE OF CONSULTATION:  Anemia  HISTORY OF PRESENTING ILLNESS:  Kristin [name] 58 y.o. female is here because of anemia  She was found to have abnormal CBC from recent blood work The patient was  initially evaluated in the emergency department a month ago and was noted to have low blood count She has baseline CBC from April 29, 2016 which was normal with hemoglobin of 13.2 On 09/13/2023, hemoglobin was 8.6 with MCV of 74.3 and platelet count of 676 Additional workup revealed low serum iron, low iron saturation but with normal vitamin B12 and renal function She was found to have a large pelvic mass on CT imaging Today, she was seen by GYN surgeon with repeat labs which show white count of 9.4, hemoglobin 7.9, MCV of 70.9 and platelet count of 760 She denies recent chest pain on exertion, shortness of breath on minimal exertion, pre-syncopal episodes, or palpitations.  She complained of generalized fatigue and weakness She had not noticed any recent bleeding such as epistaxis, hematuria or hematochezia The patient denies over the counter NSAID ingestion. She is not on antiplatelets agents. Her last colonoscopy was 3 years ago  She denies any pica and eats a variety of diet. She never donated blood or received blood transfusion The patient was prescribed oral iron supplements and she takes iron supplement daily  MEDICAL HISTORY:  Past Medical History:  Diagnosis Date   Arthritis    knee, back; improved with weight loss   Asthma    Dry eyes    Hyperlipidemia    Uterine fibroid     SURGICAL HISTORY: Past Surgical History:  Procedure Laterality Date   COLONOSCOPY     DILATATION & CURETTAGE/HYSTEROSCOPY WITH MYOSURE N/A 04/29/2016   Procedure: DILATATION & CURETTAGE/HYSTEROSCOPY WITH MYOSURE;  Surgeon: Ivery Marking, MD;  Location: [facility];  Service: Gynecology;  Laterality: N/A;   TUBAL  LIGATION     UPPER GI ENDOSCOPY      SOCIAL HISTORY: Social History   Socioeconomic History   Marital status: Single    Spouse name: Not on file   Number of children: Not on file   Years of education: Not on file   Highest education level: Not on file  Occupational History   Not  on file  Tobacco Use   Smoking status: Never   Smokeless tobacco: Never  Vaping Use   Vaping status: Never Used  Substance and Sexual Activity   Alcohol use: No   Drug use: No   Sexual activity: Not on file  Other Topics Concern   Not on file  Social History Narrative   Not on file   Social Drivers of Health   Financial Resource Strain: Not on file  Food Insecurity: Not on file  Transportation Needs: Not on file  Physical Activity: Not on file  Stress: Not on file  Social Connections: Not on file  Intimate Partner Violence: Not on file    FAMILY HISTORY: Family History  Problem Relation Age of Onset   Breast cancer Neg Hx    Colon cancer Neg Hx    Pancreatic cancer Neg Hx    Prostate cancer Neg Hx    Ovarian cancer Neg Hx    Endometrial cancer Neg Hx     ALLERGIES:  has no known allergies.  MEDICATIONS:  Current Outpatient Medications  Medication Sig Dispense Refill   albuterol  (VENTOLIN  HFA) 108 (90 Base) MCG/ACT inhaler Inhale 1-2 puffs into the lungs every 6 (six) hours as needed for wheezing or shortness of breath. 18 g 0   atorvastatin (LIPITOR) 20 MG tablet Take 20 mg by mouth daily.     ferrous sulfate  325 (65 FE) MG tablet Take 1 tablet (325 mg total) by mouth daily. 30 tablet 0   fluticasone  (FLONASE ) 50 MCG/ACT nasal spray Place 1 spray into both nostrils 2 (two) times daily. (Patient not taking: Reported on 09/13/2023) 16 g 2   Lifitegrast (XIIDRA) 5 % SOLN Place 1 drop into both eyes 2 (two) times daily.     Multiple Vitamins-Calcium (ONE-A-DAY WOMENS PO) Take 1 tablet by mouth every morning.     Polyethyl Glycol-Propyl Glycol (SYSTANE OP) Apply 1 drop to eye 2 (two) times daily.     senna-docusate (SENOKOT-S) 8.6-50 MG tablet Take 2 tablets by mouth at bedtime. For AFTER surgery, do not take if having diarrhea 30 tablet 0   traMADol (ULTRAM) 50 MG tablet Take 1 tablet (50 mg total) by mouth every 6 (six) hours as needed for severe pain (pain score 7-10).  For AFTER surgery only, do not take and drive 10 tablet 0   No current facility-administered medications for this visit.    REVIEW OF SYSTEMS:  All other systems were reviewed with the patient and are negative.  PHYSICAL EXAMINATION: ECOG PERFORMANCE STATUS: 1 - Symptomatic but completely ambulatory  Vitals:   10/13/23 1344  Pulse: (!) 101  Resp: 18   There were no vitals filed for this visit.  GENERAL:alert, no distress and comfortable SKIN: skin color is pale EYES: normal, conjunctiva are pale and non-injected, sclera clear OROPHARYNX:no exudate, no erythema and lips, buccal mucosa, and tongue normal  NECK: supple, thyroid  normal size, non-tender, without nodularity LYMPH:  no palpable lymphadenopathy in the cervical, axillary or inguinal LUNGS: clear to auscultation and percussion with normal breathing effort HEART: Mild tachycardia, no murmurs and no lower extremity edema  ABDOMEN:abdomen soft, palpable large pelvic mass Musculoskeletal:no cyanosis of digits and no clubbing  PSYCH: alert & oriented x 3 with fluent speech NEURO: no focal motor/sensory deficits  RADIOGRAPHIC STUDIES: I have reviewed her CT imaging of the abdomen I have personally reviewed the radiological images as listed and agreed with the findings in the report. DG Chest 2 View Result Date: 09/13/2023 CLINICAL DATA:  Septic workup with unintentional weight loss. EXAM: CHEST - 2 VIEW COMPARISON:  Sep 24, 2022 FINDINGS: The heart size and mediastinal contours are within normal limits. A 4 mm nodular opacity is seen overlying the lateral aspect of the right upper lobe. There is no evidence of an acute infiltrate, pleural effusion or pneumothorax. The visualized skeletal structures are unremarkable. IMPRESSION: 4 mm nodular opacity overlying the lateral aspect of the right upper lobe. Correlation with nonemergent chest CT is recommended to exclude the presence of a pulmonary nodule. Electronically Signed   By:  Virgle Grime M.D.   On: 09/13/2023 19:33

## 2023-10-14 ENCOUNTER — Ambulatory Visit: Admitting: Acute Care

## 2023-10-14 ENCOUNTER — Encounter: Payer: Self-pay | Admitting: Obstetrics & Gynecology

## 2023-10-17 ENCOUNTER — Telehealth: Payer: Self-pay

## 2023-10-17 DIAGNOSIS — D509 Iron deficiency anemia, unspecified: Secondary | ICD-10-CM | POA: Diagnosis not present

## 2023-10-17 DIAGNOSIS — R634 Abnormal weight loss: Secondary | ICD-10-CM | POA: Diagnosis not present

## 2023-10-17 DIAGNOSIS — Z1331 Encounter for screening for depression: Secondary | ICD-10-CM | POA: Diagnosis not present

## 2023-10-17 DIAGNOSIS — D75838 Other thrombocytosis: Secondary | ICD-10-CM | POA: Diagnosis not present

## 2023-10-17 DIAGNOSIS — R1900 Intra-abdominal and pelvic swelling, mass and lump, unspecified site: Secondary | ICD-10-CM | POA: Diagnosis not present

## 2023-10-17 NOTE — Telephone Encounter (Signed)
 Dr. Marton Sleeper, patient will be scheduled as soon as possible.  Auth Submission: APPROVED Site of care: Site of care: [facility] Payer: [employer] commercial Medication & CPT/J Code(s) submitted: Feraheme (ferumoxytol) [id number] Diagnosis Code:  Route of submission (phone, fax, portal): phone Microbiologist Rx Phone #  Fax # Auth type: Buy/Bill PB Units/visits requested: 510mg  x 2 doses Reference number: [id number] Approval from: 10/14/23 to 04/11/24

## 2023-10-18 NOTE — Progress Notes (Addendum)
 Anesthesia Review:  PCP: Kristin [name]  Cardiologist : none  Oncology- Kristin Kristin [name] 10/13/23  Pulm-Kristin Groce,Kristin [name] [name] 10/31/23.   PPM/ ICD: Device Orders: Rep Notified:  Chest x-ray : 09/13/23- 2 view  EKG : 09/13/2023  Echo : Stress test: Cardiac Cath :   Activity level: can do a flight of stairs without difficutly  Sleep Study/ CPAP : none  Fasting Blood Sugar :      / Checks Blood Sugar -- times a day:    Blood Thinner/ Instructions /Last Dose: ASA / Instructions/ Last Dose :    10/13/23- cbc -hgb 7.9  PT taking po iron currently and has received 2 iron transfusions per pt  09/13/23- Ed Visit- Anemia    PT was     35    minutes late for preop appt.  Son with pt at preop appt.    Research spoke with pt at preop appt.  Consent on front of chart.    CBC done 10/31/23 with hgb of 8.2 and pltc of 847 routed to Kristin [name] and Kristin [name] on 10/31/2023.  Called OB/GYN office and spoke with Kristin [name] on 10/31/2023  in office and she will inform Kristin [name] who is in office today.  Kristin [name] with Anesthesia made aware.  On 10/31/23.

## 2023-10-18 NOTE — Patient Instructions (Addendum)
 SURGICAL WAITING ROOM VISITATION  Patients having surgery or a procedure may have no more than 2 support people in the waiting area - these visitors may rotate.    Children under the age of 73 must have an adult with them who is not the patient.  Visitors with respiratory illnesses are discouraged from visiting and should remain at home.  If the patient needs to stay at the hospital during part of their recovery, the visitor guidelines for inpatient rooms apply. Pre-op nurse will coordinate an appropriate time for 1 support person to accompany patient in pre-op.  This support person may not rotate.    Please refer to the [facility] website for the visitor guidelines for Inpatients (after your surgery is over and you are in a regular room).       Your procedure is scheduled on:  11/02/2023    Report to [facility] Main Entrance    Report to admitting at 1030 AM   Call this number if you have problems the morning of surgery [phone]   Do not eat food :After Midnight.            EAt  a light diet the day before surgery  Avoid gas producing foods and carbonated beverages    After Midnight you may have the following liquids until ___ 0930___ AM  DAY OF SURGERY  Water Non-Citrus Juices (without pulp, NO RED-Apple, White grape, White cranberry) Black Coffee (NO MILK/CREAM OR CREAMERS, sugar ok)  Clear Tea (NO MILK/CREAM OR CREAMERS, sugar ok) regular and decaf                             Plain Jell-O (NO RED)                                           Fruit ices (not with fruit pulp, NO RED)                                     Popsicles (NO RED)                                                               Sports drinks like Gatorade (NO RED)                          If you have questions, please contact your surgeon's office.       Oral Hygiene is also important to reduce your risk of infection.                                    Remember - BRUSH YOUR TEETH THE  MORNING OF SURGERY WITH YOUR REGULAR TOOTHPASTE  DENTURES WILL BE REMOVED PRIOR TO SURGERY PLEASE DO NOT APPLY Poly grip OR ADHESIVES!!!   Do NOT smoke after Midnight   Stop all vitamins and herbal supplements 7 days before surgery.   Take these medicines the morning of surgery  with A SIP OF WATER: inhalers as usual and bring, eye drops as usual   DO NOT TAKE ANY ORAL DIABETIC MEDICATIONS DAY OF YOUR SURGERY  Bring CPAP mask and tubing day of surgery.                              You may not have any metal on your body including hair pins, jewelry, and body piercing             Do not wear make-up, lotions, powders, perfumes/cologne, or deodorant  Do not wear nail polish including gel and S&S, artificial/acrylic nails, or any other type of covering on natural nails including finger and toenails. If you have artificial nails, gel coating, etc. that needs to be removed by a nail salon please have this removed prior to surgery or surgery may need to be canceled/ delayed if the surgeon/ anesthesia feels like they are unable to be safely monitored.   Do not shave  48 hours prior to surgery.               Men may shave face and neck.   Do not bring valuables to the hospital. [facility] IS NOT             RESPONSIBLE   FOR VALUABLES.   Contacts, glasses, dentures or bridgework may not be worn into surgery.   Bring small overnight bag day of surgery.   DO NOT BRING YOUR HOME MEDICATIONS TO THE HOSPITAL. PHARMACY WILL DISPENSE MEDICATIONS LISTED ON YOUR MEDICATION LIST TO YOU DURING YOUR ADMISSION IN THE HOSPITAL!    Patients discharged on the day of surgery will not be allowed to drive home.  Someone NEEDS to stay with you for the first 24 hours after anesthesia.   Special Instructions: Bring a copy of your healthcare power of attorney and living will documents the day of surgery if you haven't scanned them before.              Please read over the following fact sheets you were  given: IF YOU HAVE QUESTIONS ABOUT YOUR PRE-OP INSTRUCTIONS PLEASE CALL [phone]   If you received a COVID test during your pre-op visit  it is requested that you wear a mask when out in public, stay away from anyone that may not be feeling well and notify your surgeon if you develop symptoms. If you test positive for Covid or have been in contact with anyone that has tested positive in the last 10 days please notify you surgeon.    [facility] - Preparing for Surgery Before surgery, you can play an important role.  Because skin is not sterile, your skin needs to be as free of germs as possible.  You can reduce the number of germs on your skin by washing with CHG (chlorahexidine gluconate) soap before surgery.  CHG is an antiseptic cleaner which kills germs and bonds with the skin to continue killing germs even after washing. Please DO NOT use if you have an allergy to CHG or antibacterial soaps.  If your skin becomes reddened/irritated stop using the CHG and inform your nurse when you arrive at Short Stay. Do not shave (including legs and underarms) for at least 48 hours prior to the first CHG shower.  You may shave your face/neck. Please follow these instructions carefully:  1.  Shower with CHG Soap the night before surgery and the  morning of Surgery.  2.  If you choose to wash your hair, wash your hair first as usual with your  normal  shampoo.  3.  After you shampoo, rinse your hair and body thoroughly to remove the  shampoo.                           4.  Use CHG as you would any other liquid soap.  You can apply chg directly  to the skin and wash                       Gently with a scrungie or clean washcloth.  5.  Apply the CHG Soap to your body ONLY FROM THE NECK DOWN.   Do not use on face/ open                           Wound or open sores. Avoid contact with eyes, ears mouth and genitals (private parts).                       Wash face,  Genitals (private parts) with your normal soap.              6.  Wash thoroughly, paying special attention to the area where your surgery  will be performed.  7.  Thoroughly rinse your body with warm water from the neck down.  8.  DO NOT shower/wash with your normal soap after using and rinsing off  the CHG Soap.                9.  Pat yourself dry with a clean towel.            10.  Wear clean pajamas.            11.  Place clean sheets on your bed the night of your first shower and do not  sleep with pets. Day of Surgery : Do not apply any lotions/deodorants the morning of surgery.  Please wear clean clothes to the hospital/surgery center.  FAILURE TO FOLLOW THESE INSTRUCTIONS MAY RESULT IN THE CANCELLATION OF YOUR SURGERY PATIENT SIGNATURE_________________________________  NURSE SIGNATURE__________________________________  ________________________________________________________________________

## 2023-10-20 ENCOUNTER — Ambulatory Visit (INDEPENDENT_AMBULATORY_CARE_PROVIDER_SITE_OTHER)

## 2023-10-20 VITALS — BP 156/81 | HR 116 | Temp 99.7°F | Resp 18 | Ht 64.0 in | Wt 168.8 lb

## 2023-10-20 DIAGNOSIS — D509 Iron deficiency anemia, unspecified: Secondary | ICD-10-CM | POA: Diagnosis not present

## 2023-10-20 MED ORDER — ACETAMINOPHEN 325 MG PO TABS
650.0000 mg | ORAL_TABLET | Freq: Once | ORAL | Status: AC
  Administered 2023-10-20: 650 mg via ORAL
  Filled 2023-10-20: qty 2

## 2023-10-20 MED ORDER — DIPHENHYDRAMINE HCL 25 MG PO CAPS
25.0000 mg | ORAL_CAPSULE | Freq: Once | ORAL | Status: AC
  Administered 2023-10-20: 25 mg via ORAL
  Filled 2023-10-20: qty 1

## 2023-10-20 MED ORDER — SODIUM CHLORIDE 0.9 % IV SOLN
510.0000 mg | Freq: Once | INTRAVENOUS | Status: AC
  Administered 2023-10-20: 510 mg via INTRAVENOUS
  Filled 2023-10-20: qty 17

## 2023-10-20 NOTE — Progress Notes (Signed)
 Diagnosis: Iron Deficiency Anemia  Provider:  Praveen Mannam MD  Procedure: IV Infusion  IV Type: Peripheral, IV Location: R Antecubital  Feraheme (Ferumoxytol), Dose: 510 mg  Infusion Start Time: 1635  Infusion Stop Time: 1654  Post Infusion IV Care: Observation period completed and Peripheral IV Discontinued  Discharge: Condition: Good, Destination: Home . AVS Provided  Performed by:  Rachelle Bue, RN

## 2023-10-24 ENCOUNTER — Ambulatory Visit: Payer: Self-pay | Admitting: Gynecologic Oncology

## 2023-10-24 ENCOUNTER — Ambulatory Visit (HOSPITAL_COMMUNITY)
Admission: RE | Admit: 2023-10-24 | Discharge: 2023-10-24 | Disposition: A | Discharge status: Home / Self Care | Source: Ambulatory Visit | Attending: Gynecologic Oncology | Admitting: Gynecologic Oncology

## 2023-10-24 ENCOUNTER — Other Ambulatory Visit: Payer: Self-pay | Admitting: Gynecologic Oncology

## 2023-10-24 DIAGNOSIS — R000 Tachycardia, unspecified: Secondary | ICD-10-CM

## 2023-10-24 DIAGNOSIS — R918 Other nonspecific abnormal finding of lung field: Secondary | ICD-10-CM | POA: Diagnosis not present

## 2023-10-24 DIAGNOSIS — R1900 Intra-abdominal and pelvic swelling, mass and lump, unspecified site: Secondary | ICD-10-CM

## 2023-10-24 DIAGNOSIS — R634 Abnormal weight loss: Secondary | ICD-10-CM

## 2023-10-24 DIAGNOSIS — K7689 Other specified diseases of liver: Secondary | ICD-10-CM | POA: Diagnosis not present

## 2023-10-24 DIAGNOSIS — D509 Iron deficiency anemia, unspecified: Secondary | ICD-10-CM

## 2023-10-24 MED ORDER — SODIUM CHLORIDE (PF) 0.9 % IJ SOLN
INTRAMUSCULAR | Status: AC
  Filled 2023-10-24: qty 50

## 2023-10-24 MED ORDER — IOHEXOL 300 MG/ML  SOLN: 75.0000 mL | Freq: Once | INTRAMUSCULAR | Status: DC | PRN

## 2023-10-24 MED ORDER — IOHEXOL 350 MG/ML SOLN
75.0000 mL | Freq: Once | INTRAVENOUS | Status: AC | PRN
  Administered 2023-10-24: 75 mL via INTRAVENOUS

## 2023-10-24 NOTE — Telephone Encounter (Signed)
 Per Dr.Tucker, I reached out to Ms.Fader, she is aware of the negative results of recent CT chest as reported by Dr.Tucker. She is excited for the news and was thankful for the call.

## 2023-10-24 NOTE — Telephone Encounter (Signed)
-----   Message from [name] sent at 10/24/2023  8:58 AM EDT ----- Could you please let her know that her chest CT scan did not show any obvious spots in her lungs and was negative for a blood clot in her lungs? Thank you! ----- Message ----- From: Interface, Rad Results In Sent: 10/24/2023   8:27 AM EDT To: [name], MD

## 2023-10-26 ENCOUNTER — Other Ambulatory Visit (HOSPITAL_COMMUNITY)

## 2023-10-27 ENCOUNTER — Telehealth: Payer: Self-pay

## 2023-10-27 ENCOUNTER — Ambulatory Visit

## 2023-10-27 ENCOUNTER — Inpatient Hospital Stay: Admitting: Gynecologic Oncology

## 2023-10-27 VITALS — BP 126/72 | HR 104 | Temp 100.1°F | Resp 20 | Ht 64.0 in | Wt 169.8 lb

## 2023-10-27 DIAGNOSIS — R1900 Intra-abdominal and pelvic swelling, mass and lump, unspecified site: Secondary | ICD-10-CM

## 2023-10-27 DIAGNOSIS — D509 Iron deficiency anemia, unspecified: Secondary | ICD-10-CM | POA: Diagnosis not present

## 2023-10-27 MED ORDER — DIPHENHYDRAMINE HCL 25 MG PO CAPS
25.0000 mg | ORAL_CAPSULE | Freq: Once | ORAL | Status: AC
  Administered 2023-10-27: 25 mg via ORAL
  Filled 2023-10-27: qty 1

## 2023-10-27 MED ORDER — ACETAMINOPHEN 325 MG PO TABS
650.0000 mg | ORAL_TABLET | Freq: Once | ORAL | Status: AC
  Administered 2023-10-27: 650 mg via ORAL
  Filled 2023-10-27: qty 2

## 2023-10-27 MED ORDER — SODIUM CHLORIDE 0.9 % IV SOLN
510.0000 mg | Freq: Once | INTRAVENOUS | Status: AC
  Administered 2023-10-27: 510 mg via INTRAVENOUS
  Filled 2023-10-27: qty 17

## 2023-10-27 NOTE — Progress Notes (Signed)
 Called patient just before and just after scheduled time for phone visit. No answer. Left voicemail asking her to return call tomorrow.  Comer Dollar MD Gynecologic Oncology

## 2023-10-27 NOTE — Telephone Encounter (Signed)
 Opened in error

## 2023-10-27 NOTE — Progress Notes (Signed)
 Diagnosis: Iron Deficiency Anemia  Provider:  Praveen Mannam MD  Procedure: IV Infusion  IV Type: Peripheral, IV Location: R Antecubital  Feraheme (Ferumoxytol ), Dose: 510 mg  Infusion Start Time: 1554  Infusion Stop Time: 1610  Post Infusion IV Care: Observation period completed and Peripheral IV Discontinued  Discharge: Condition: Good, Destination: Home . AVS Declined  Performed by:  Maximiano JONELLE Pouch, LPN

## 2023-10-27 NOTE — Telephone Encounter (Signed)
 Was leaving a message for patient to let her know the phone call scheduled for 6/27 has been moved to 6/26.. machine cut off the phone call.[name]

## 2023-10-28 ENCOUNTER — Inpatient Hospital Stay: Admitting: Gynecologic Oncology

## 2023-10-31 ENCOUNTER — Encounter (HOSPITAL_COMMUNITY): Payer: Self-pay

## 2023-10-31 ENCOUNTER — Encounter (HOSPITAL_COMMUNITY)
Admission: RE | Admit: 2023-10-31 | Discharge: 2023-10-31 | Disposition: A | Discharge status: Home / Self Care | Payer: Self-pay | Source: Ambulatory Visit | Attending: Gynecologic Oncology | Admitting: Gynecologic Oncology

## 2023-10-31 ENCOUNTER — Inpatient Hospital Stay: Admitting: Gynecologic Oncology

## 2023-10-31 ENCOUNTER — Other Ambulatory Visit: Payer: Self-pay

## 2023-10-31 ENCOUNTER — Ambulatory Visit (INDEPENDENT_AMBULATORY_CARE_PROVIDER_SITE_OTHER): Admitting: Acute Care

## 2023-10-31 ENCOUNTER — Encounter: Payer: Self-pay | Admitting: Acute Care

## 2023-10-31 VITALS — BP 132/77 | HR 110 | Ht 64.0 in | Wt 166.8 lb

## 2023-10-31 DIAGNOSIS — R911 Solitary pulmonary nodule: Secondary | ICD-10-CM

## 2023-10-31 DIAGNOSIS — N858 Other specified noninflammatory disorders of uterus: Secondary | ICD-10-CM | POA: Diagnosis not present

## 2023-10-31 DIAGNOSIS — R1900 Intra-abdominal and pelvic swelling, mass and lump, unspecified site: Secondary | ICD-10-CM

## 2023-10-31 DIAGNOSIS — C543 Malignant neoplasm of fundus uteri: Secondary | ICD-10-CM | POA: Diagnosis not present

## 2023-10-31 DIAGNOSIS — D259 Leiomyoma of uterus, unspecified: Secondary | ICD-10-CM

## 2023-10-31 DIAGNOSIS — Z7901 Long term (current) use of anticoagulants: Secondary | ICD-10-CM | POA: Diagnosis not present

## 2023-10-31 DIAGNOSIS — D509 Iron deficiency anemia, unspecified: Secondary | ICD-10-CM

## 2023-10-31 DIAGNOSIS — C55 Malignant neoplasm of uterus, part unspecified: Secondary | ICD-10-CM | POA: Diagnosis not present

## 2023-10-31 DIAGNOSIS — Z4682 Encounter for fitting and adjustment of non-vascular catheter: Secondary | ICD-10-CM | POA: Diagnosis not present

## 2023-10-31 DIAGNOSIS — K7689 Other specified diseases of liver: Secondary | ICD-10-CM | POA: Diagnosis not present

## 2023-10-31 DIAGNOSIS — Z79899 Other long term (current) drug therapy: Secondary | ICD-10-CM | POA: Diagnosis not present

## 2023-10-31 DIAGNOSIS — E785 Hyperlipidemia, unspecified: Secondary | ICD-10-CM | POA: Diagnosis not present

## 2023-10-31 DIAGNOSIS — Z01812 Encounter for preprocedural laboratory examination: Secondary | ICD-10-CM | POA: Insufficient documentation

## 2023-10-31 DIAGNOSIS — J45909 Unspecified asthma, uncomplicated: Secondary | ICD-10-CM | POA: Diagnosis not present

## 2023-10-31 DIAGNOSIS — C786 Secondary malignant neoplasm of retroperitoneum and peritoneum: Secondary | ICD-10-CM | POA: Diagnosis not present

## 2023-10-31 DIAGNOSIS — I10 Essential (primary) hypertension: Secondary | ICD-10-CM | POA: Diagnosis not present

## 2023-10-31 DIAGNOSIS — J4520 Mild intermittent asthma, uncomplicated: Secondary | ICD-10-CM | POA: Diagnosis not present

## 2023-10-31 DIAGNOSIS — R000 Tachycardia, unspecified: Secondary | ICD-10-CM

## 2023-10-31 HISTORY — DX: Anemia, unspecified: D64.9

## 2023-10-31 HISTORY — DX: Gastro-esophageal reflux disease without esophagitis: K21.9

## 2023-10-31 LAB — CBC
HCT: 28.6 % — ABNORMAL LOW (ref 36.0–46.0)
Hemoglobin: 8.2 g/dL — ABNORMAL LOW (ref 12.0–15.0)
MCH: 22 pg — ABNORMAL LOW (ref 26.0–34.0)
MCHC: 28.7 g/dL — ABNORMAL LOW (ref 30.0–36.0)
MCV: 76.7 fL — ABNORMAL LOW (ref 80.0–100.0)
Platelets: 847 10*3/uL — ABNORMAL HIGH (ref 150–400)
RBC: 3.73 MIL/uL — ABNORMAL LOW (ref 3.87–5.11)
RDW: 22.2 % — ABNORMAL HIGH (ref 11.5–15.5)
WBC: 9.3 10*3/uL (ref 4.0–10.5)
nRBC: 0.3 % — ABNORMAL HIGH (ref 0.0–0.2)

## 2023-10-31 LAB — COMPREHENSIVE METABOLIC PANEL WITH GFR
ALT: 13 U/L (ref 0–44)
AST: 14 U/L — ABNORMAL LOW (ref 15–41)
Albumin: 2.3 g/dL — ABNORMAL LOW (ref 3.5–5.0)
Alkaline Phosphatase: 113 U/L (ref 38–126)
Anion gap: 10 (ref 5–15)
BUN: 7 mg/dL (ref 6–20)
CO2: 25 mmol/L (ref 22–32)
Calcium: 8.7 mg/dL — ABNORMAL LOW (ref 8.9–10.3)
Chloride: 102 mmol/L (ref 98–111)
Creatinine, Ser: 0.73 mg/dL (ref 0.44–1.00)
GFR, Estimated: >60 mL/min (ref >60)
Glucose, Bld: 149 mg/dL — ABNORMAL HIGH (ref 70–99)
Potassium: 3.3 mmol/L — ABNORMAL LOW (ref 3.5–5.1)
Sodium: 137 mmol/L (ref 135–145)
Total Bilirubin: 0.4 mg/dL (ref 0.0–1.2)
Total Protein: 8 g/dL (ref 6.5–8.1)

## 2023-10-31 NOTE — Progress Notes (Signed)
 I called the patient again today to discuss surgery. She did not pick up my initial call but called back. We reviewed plan again for diagnostic laparoscopy, Robotic versus open total hysterectomy with BSO. Decision will be made at the time of diagnostic laparoscopy, whether the majority of the surgery can be done in a minimally, invasive manner. I stress that even if this is the case, because of the size of her uterus, a larger incision will be required for specimen removal. The specimen will then be sent for frozen section to help guide any additional procedures. We reviewed risk of surgery which include but are not limited to bleeding, need for blood transfusion, infection, damage to surrounding structures requiring repair, VTE, postoperative pneumonia, stroke, heart attack, and rarely death.  All of the patients questions were answered today. Because of her chronic anemia, we discussed she may very likely need a blood transfusion in the morning of surgery. We will check a CBC when she arrives.   Comer Dollar MD

## 2023-10-31 NOTE — Progress Notes (Signed)
 History of Present Illness Kristin [name] is a 58 y.o. female never smoker with an incidental finding of a 4 mm pulmonary nodule on CT chest in May 2925. She was referred by Dr. Comer Dollar.   10/31/2023 Kristin [name] is a 58 year old female never smoker who presents for follow-up of a pulmonary nodule.  In May 2025 , a chest x-ray revealed a small four millimeter nodule, prompting a follow-up CT scan. The CT scan showed no significant pulmonary lesions, pleural effusion, pneumothorax, or lymph node adenopathy. The lungs were clear.   She has a history of asthma but has not experienced recent episodes and has not used her albuterol  inhaler or breathing treatments for a long time. She uses albuterol  as needed. She denies coughing, or hemoptysis. No shortness of breath or chest pain.  She reports significant weight loss, having lost over 30 pounds since July of  2025.  She attributes the weight loss to fibroids, which are the etiology of her  anemia. She is currently on iron pills and has received iron infusions twice. Her hemoglobin was noted to be as low as 7.9 on 10/16/2023.She is scheduled   She experienced a high heart rate and temperature, which led to an emergency room visit 09/15/2023. Presentation concerning for the possibility of sepsis. Patient's ED workup overall was reassuring. No leukocytosis. No lactic acidosis. Patient's TSH was normal. Chest x-ray does not show evidence of pneumonia but there is possible lung nodule which we are following up on today. She was anemic , hemocult was negative. ED recommended a colonoscopy.  She is scheduled 11/02/2023 for diagnostic laparoscopy, Robotic versus open total hysterectomy with BSO.    Her heart rate was noted to be elevated at 110 bpm today at rest. It is very regular on pulse check. She she has a history of elevated heart rate during a COVID-related visit, which led to a cardiology evaluation. She wore a heart monitor for three  weeks, which returned normal results. She can feel when her heart rate is high, especially when excited, but it feels regular to her. She has no history of cancer and denies smoking or secondhand smoke exposure.  Patient lives with her family.  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  She has not had any recent travel in the last 3 months.  She does have children.  She has a history of asthma as noted above rarely causes her any issues.  She utilizes albuterol  as needed.  She has taken prednisone  in the past but has not taken it for many many years.  Again she does note about a 30 pound weight loss over the last 12 months.  She feels this is related to her fibroid uterine tumor.  Test Results: CT Chest 10/24/2023 Mediastinum/Nodes: No hematoma, mass, or adenopathy.  Lungs/Pleura: No pleural effusion. No pneumothorax. Minimal linear scarring posteriorly in both lung bases. Lungs otherwise clear. No acute findings.  No significant pulmonary lesion. 2. Aberrant right subclavian artery, anatomic variant.  CXR 09/13/2023 4 mm nodular opacity overlying the lateral aspect of the right upper lobe. Correlation with nonemergent chest CT is recommended to exclude the presence of a pulmonary nodule.     Latest Ref Rng & Units 10/31/2023    3:13 PM 10/13/2023   12:27 PM 09/13/2023    5:56 PM  CBC  WBC 4.0 - 10.5 K/uL 9.3  9.4  6.3   Hemoglobin 12.0 - 15.0 g/dL 8.2  7.9  8.6  Hematocrit 36.0 - 46.0 % 28.6  26.3  27.8   Platelets 150 - 400 K/uL 847  760  676        Latest Ref Rng & Units 10/31/2023    3:13 PM 09/13/2023    5:56 PM  BMP  Glucose 70 - 99 mg/dL 850  895   BUN 6 - 20 mg/dL 7  12   Creatinine 9.55 - 1.00 mg/dL 9.26  9.13   Sodium 864 - 145 mmol/L 137  134   Potassium 3.5 - 5.1 mmol/L 3.3  3.7   Chloride 98 - 111 mmol/L 102  101   CO2 22 - 32 mmol/L 25  23   Calcium 8.9 - 10.3 mg/dL 8.7  8.5     BNP No results found for: BNP  ProBNP No results found for: PROBNP  PFT No  results found for: FEV1PRE, FEV1POST, FVCPRE, FVCPOST, TLC, DLCOUNC, PREFEV1FVCRT, PSTFEV1FVCRT  CT Angio Chest Pulmonary Embolism (PE) W or WO Contrast Result Date: 10/24/2023 CLINICAL DATA:  Lung nodules, multiple EXAM: CT ANGIOGRAPHY CHEST WITH CONTRAST TECHNIQUE: Multidetector CT imaging of the chest was performed using the standard protocol during bolus administration of intravenous contrast. Multiplanar CT image reconstructions and MIPs were obtained to evaluate the vascular anatomy. RADIATION DOSE REDUCTION: This exam was performed according to the departmental dose-optimization program which includes automated exposure control, adjustment of the mA and/or kV according to patient size and/or use of iterative reconstruction technique. CONTRAST:  75mL OMNIPAQUE  IOHEXOL  350 MG/ML SOLN COMPARISON:  CT abdomen 07/14/2023 FINDINGS: Cardiovascular: Heart size normal. Trace pericardial fluid. 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 Mediastinum/Nodes: No hematoma, mass, or adenopathy. Lungs/Pleura: No pleural effusion. No pneumothorax. Minimal linear scarring posteriorly in both lung bases. Lungs otherwise clear. Upper Abdomen:  Hepatic cysts as before.  No acute findings. Musculoskeletal: Vertebral endplate spurring at multiple levels in the mid and lower thoracic spine. Review of the MIP images confirms the above findings. IMPRESSION: 1. No acute findings.  No significant pulmonary lesion. 2. Aberrant right subclavian artery, anatomic variant. Electronically Signed   By: JONETTA Faes M.D.   On: 10/24/2023 08:25     Past medical hx Past Medical History:  Diagnosis Date   Anemia    Arthritis    knee, back; improved with weight loss   Asthma    Dry eyes    GERD (gastroesophageal reflux disease)    Hyperlipidemia    Uterine fibroid      Social History   Tobacco Use   Smoking status: Never    Passive exposure: Never   Smokeless tobacco:  Never  Vaping Use   Vaping status: Never Used  Substance Use Topics   Alcohol use: No   Drug use: No    Ms.Gatt reports that she has never smoked. She has never been exposed to tobacco smoke. She has never used smokeless tobacco. She reports that she does not drink alcohol and does not use drugs.  Tobacco Cessation: Counseling given: Not Answered Never smoker   Past surgical hx, Family hx, Social hx all reviewed.  Current Outpatient Medications on File Prior to Visit  Medication Sig   albuterol  (PROVENTIL ) (2.5 MG/3ML) 0.083% nebulizer solution Take 2.5 mg by nebulization every 6 (six) hours as needed for wheezing.   albuterol  (VENTOLIN  HFA) 108 (90 Base) MCG/ACT inhaler Inhale 1-2 puffs into the lungs every 6 (six) hours as needed for wheezing or shortness of breath.   atorvastatin (LIPITOR) 20 MG tablet  Take 20 mg by mouth at bedtime.   ferrous sulfate  325 (65 FE) MG tablet Take 1 tablet (325 mg total) by mouth daily.   Lifitegrast (XIIDRA) 5 % SOLN Place 1 drop into both eyes in the morning.   Multiple Vitamin (MULTIVITAMIN WITH MINERALS) TABS tablet Take 1 tablet by mouth daily.   Polyethyl Glycol-Propyl Glycol (SYSTANE OP) Place 1 drop into both eyes 3 (three) times daily as needed (dry/irritated eyes.).   senna-docusate (SENOKOT-S) 8.6-50 MG tablet Take 2 tablets by mouth at bedtime. For AFTER surgery, do not take if having diarrhea   traMADol  (ULTRAM ) 50 MG tablet Take 1 tablet (50 mg total) by mouth every 6 (six) hours as needed for severe pain (pain score 7-10). For AFTER surgery only, do not take and drive   No current facility-administered medications on file prior to visit.     No Known Allergies  Review Of Systems:  Constitutional:   +  weight loss, No night sweats,  Fevers, chills, fatigue, or  lassitude.  HEENT:   No headaches,  Difficulty swallowing,  Tooth/dental problems, or  Sore throat,                No sneezing, itching, ear ache, nasal congestion, post  nasal drip,   CV:  No chest pain,  Orthopnea, PND, swelling in lower extremities, anasarca, dizziness, palpitations, syncope.   GI  No heartburn, indigestion, abdominal pain, nausea, vomiting, diarrhea, change in bowel habits, loss of appetite, bloody stools.   Resp: No shortness of breath with exertion or at rest.  No excess mucus, no productive cough,  No non-productive cough,  No coughing up of blood.  No change in color of mucus.  No wheezing.  No chest wall deformity  Skin: no rash or lesions.  GU: no dysuria, change in color of urine, no urgency or frequency.  No flank pain, no hematuria   MS:  No joint pain or swelling.  No decreased range of motion.  No back pain.  Psych:  No change in mood or affect. No depression or anxiety.  No memory loss.   Vital Signs BP 132/77 (BP Location: Left Arm, Patient Position: Sitting, Cuff Size: Normal)   Pulse (!) 110   Ht 5' 4 (1.626 m)   Wt 166 lb 12.8 oz (75.7 kg)   SpO2 99%   BMI 28.63 kg/m    Physical Exam:  General- No distress,  A&Ox3 ENT: No sinus tenderness, TM clear, pale nasal mucosa, no oral exudate,no post nasal drip, no LAN Cardiac: S1, S2, regular rate and rhythm, no murmur Chest: No wheeze/ rales/ dullness; no accessory muscle use, no nasal flaring, no sternal retractions Abd.: Soft Non-tender Ext: No clubbing cyanosis, edema Neuro:  normal strength Skin: No rashes, warm and dry Psych: normal mood and behavior   Assessment/Plan Pulmonary nodule 4 mm nodule identified on chest x-ray; not significant on follow up CT Chest.  Monitoring required due to weight loss. - Order follow-up CT chest without contrast in September to re-evaluate nodule. - Schedule follow-up appointment to review CT results after scan has been completed.  Asthma Well-controlled, no recent exacerbations, minimal albuterol  use.  Tachycardia Persistent elevated heart rate, normal EKG, exacerbated by excitement or anxiety. - Refer to  cardiologist Dr. Levern for evaluation. - Recommend cardiac evaluation including heart echo and labs.  Iron deficiency anemia Hemoglobin at 7.9, related to uterine fibroids. Receiving iron infusions and oral supplementation.  Uterine fibroid Significant weight loss likely due to  fibroids. - Scheduled for surgery to address fibroids.   I spent 45 minutes dedicated to the care of this patient on the date of this encounter to include pre-visit review of records, face-to-face time with the patient discussing conditions above, post visit ordering of testing, clinical documentation with the electronic health record, making appropriate referrals as documented, and communicating necessary information to the patient's healthcare team.   Lauraine JULIANNA Lites, NP 10/31/2023  5:53 PM

## 2023-10-31 NOTE — Progress Notes (Signed)
 Telephone call to patient to review pre-operative instructions prior to her scheduled surgery on 11/02/2023. She is scheduled for a diagnostic laparoscopy, biopsies, possible robotic versus open total hysterectomy with bilateral salpingo-oophorectomy, possible debulking .  She will have her pre-admission testing appointment this pm at [facility].  The surgery was discussed in detail.        Discussed post-op pain management in detail including the aspects of the enhanced recovery pathway.  Advised her that a new prescription was sent in for Tramadol  and it is only to be used for after her upcoming surgery.  We discussed the use of tylenol  post-op and to monitor for a maximum of 4,000 mg in a 24 hour period.  Also let her know that sennakot was prescribed to be used after surgery and to hold if having loose stools.  Discussed bowel regimen in detail.     Discussed the use of SCDs and measures to take at home to prevent DVT including frequent mobility.  Reportable signs and symptoms of DVT discussed. Post-operative instructions discussed and expectations for after surgery. Incisional care discussed as well including reportable signs and symptoms including erythema, drainage, wound separation.     30 minutes spent with the patient.  Verbalizing understanding of material discussed. No needs or concerns voiced at the end of the visit.   Advised patient to call for any needs.  Advised that her post-operative medications had been prescribed and could be picked up at any time.    This appointment is included in the global surgical bundle as pre-operative teaching and has no charge.

## 2023-10-31 NOTE — Patient Instructions (Addendum)
 It is good to see you today. We have reviewed your CT Chest and your CXR. Your CT chest did not show any nodules of concern. This is reassuring. We will do a 78-month follow-up CT chest without contrast. This will be due September 2025. You will get a phone call to get this scheduled closer to the time, and then you will follow-up with me in the office about 1 week after. You will get a call to schedule the follow-up office visit. Please call to be seen sooner if you have any blood when you cough. Continue using your as needed albuterol  for any asthma flares. Please follow-up with Dr. Levern regarding elevated heart rate. Your heart rate was 110 today, it was regular, and your blood pressure is stable. Good luck with your surgery this week. Please call us  if you need us  sooner Please contact office for sooner follow up if symptoms do not improve or worsen or seek emergency care

## 2023-11-01 ENCOUNTER — Ambulatory Visit: Admitting: Acute Care

## 2023-11-01 ENCOUNTER — Telehealth: Payer: Self-pay | Admitting: *Deleted

## 2023-11-01 NOTE — Telephone Encounter (Signed)
Telephone call to check on pre-operative status.  Patient compliant with pre-operative instructions.  Reinforced nothing to eat after midnight. Clear liquids until 1145. Patient to arrive at 1245.  No questions or concerns voiced.  Instructed to call for any needs. 

## 2023-11-02 ENCOUNTER — Encounter (HOSPITAL_COMMUNITY)
Admission: RE | Disposition: A | Discharge status: Home / Self Care | Payer: Self-pay | Source: Home / Self Care | Attending: Gynecologic Oncology

## 2023-11-02 ENCOUNTER — Inpatient Hospital Stay (HOSPITAL_COMMUNITY)
Admission: RE | Admit: 2023-11-02 | Discharge: 2023-11-05 | DRG: 740 | Disposition: A | Discharge status: Home / Self Care | Attending: Gynecologic Oncology | Admitting: Gynecologic Oncology

## 2023-11-02 ENCOUNTER — Ambulatory Visit (HOSPITAL_COMMUNITY): Payer: Self-pay | Admitting: Physician Assistant

## 2023-11-02 ENCOUNTER — Other Ambulatory Visit: Payer: Self-pay

## 2023-11-02 ENCOUNTER — Ambulatory Visit (HOSPITAL_COMMUNITY): Payer: Self-pay

## 2023-11-02 ENCOUNTER — Ambulatory Visit (HOSPITAL_COMMUNITY): Payer: Self-pay | Admitting: Anesthesiology

## 2023-11-02 ENCOUNTER — Encounter (HOSPITAL_COMMUNITY): Payer: Self-pay | Admitting: Gynecologic Oncology

## 2023-11-02 DIAGNOSIS — E785 Hyperlipidemia, unspecified: Secondary | ICD-10-CM | POA: Diagnosis present

## 2023-11-02 DIAGNOSIS — Z4682 Encounter for fitting and adjustment of non-vascular catheter: Secondary | ICD-10-CM | POA: Diagnosis not present

## 2023-11-02 DIAGNOSIS — C786 Secondary malignant neoplasm of retroperitoneum and peritoneum: Secondary | ICD-10-CM | POA: Diagnosis not present

## 2023-11-02 DIAGNOSIS — R911 Solitary pulmonary nodule: Secondary | ICD-10-CM | POA: Diagnosis present

## 2023-11-02 DIAGNOSIS — N858 Other specified noninflammatory disorders of uterus: Secondary | ICD-10-CM | POA: Diagnosis not present

## 2023-11-02 DIAGNOSIS — Z79899 Other long term (current) drug therapy: Secondary | ICD-10-CM

## 2023-11-02 DIAGNOSIS — K7689 Other specified diseases of liver: Secondary | ICD-10-CM | POA: Diagnosis not present

## 2023-11-02 DIAGNOSIS — J45909 Unspecified asthma, uncomplicated: Secondary | ICD-10-CM | POA: Diagnosis present

## 2023-11-02 DIAGNOSIS — R1900 Intra-abdominal and pelvic swelling, mass and lump, unspecified site: Principal | ICD-10-CM

## 2023-11-02 DIAGNOSIS — I10 Essential (primary) hypertension: Secondary | ICD-10-CM | POA: Diagnosis not present

## 2023-11-02 DIAGNOSIS — C55 Malignant neoplasm of uterus, part unspecified: Secondary | ICD-10-CM

## 2023-11-02 DIAGNOSIS — D509 Iron deficiency anemia, unspecified: Secondary | ICD-10-CM | POA: Diagnosis not present

## 2023-11-02 DIAGNOSIS — C543 Malignant neoplasm of fundus uteri: Secondary | ICD-10-CM | POA: Diagnosis not present

## 2023-11-02 DIAGNOSIS — Z7901 Long term (current) use of anticoagulants: Secondary | ICD-10-CM | POA: Diagnosis not present

## 2023-11-02 DIAGNOSIS — C549 Malignant neoplasm of corpus uteri, unspecified: Secondary | ICD-10-CM

## 2023-11-02 HISTORY — PX: LAPAROTOMY: SHX154

## 2023-11-02 HISTORY — PX: ROBOTIC ASSISTED TOTAL HYSTERECTOMY WITH BILATERAL SALPINGO OOPHERECTOMY: SHX6086

## 2023-11-02 HISTORY — PX: LAPAROSCOPY: SHX197

## 2023-11-02 HISTORY — PX: DEBULKING: SHX6277

## 2023-11-02 LAB — ABO/RH: ABO/RH(D): O POS

## 2023-11-02 LAB — CBC
HCT: 31.1 % — ABNORMAL LOW (ref 36.0–46.0)
Hemoglobin: 9 g/dL — ABNORMAL LOW (ref 12.0–15.0)
MCH: 22.2 pg — ABNORMAL LOW (ref 26.0–34.0)
MCHC: 28.9 g/dL — ABNORMAL LOW (ref 30.0–36.0)
MCV: 76.6 fL — ABNORMAL LOW (ref 80.0–100.0)
Platelets: 890 10*3/uL — ABNORMAL HIGH (ref 150–400)
RBC: 4.06 MIL/uL (ref 3.87–5.11)
RDW: 23 % — ABNORMAL HIGH (ref 11.5–15.5)
WBC: 9.9 10*3/uL (ref 4.0–10.5)
nRBC: 0 % (ref 0.0–0.2)

## 2023-11-02 LAB — BASIC METABOLIC PANEL WITH GFR
Anion gap: 15 (ref 5–15)
BUN: 7 mg/dL (ref 6–20)
CO2: 22 mmol/L (ref 22–32)
Calcium: 8.9 mg/dL (ref 8.9–10.3)
Chloride: 101 mmol/L (ref 98–111)
Creatinine, Ser: 0.76 mg/dL (ref 0.44–1.00)
GFR, Estimated: >60 mL/min (ref >60)
Glucose, Bld: 112 mg/dL — ABNORMAL HIGH (ref 70–99)
Potassium: 2.9 mmol/L — ABNORMAL LOW (ref 3.5–5.1)
Sodium: 138 mmol/L (ref 135–145)

## 2023-11-02 LAB — PREPARE RBC (CROSSMATCH)

## 2023-11-02 SURGERY — LAPAROSCOPY, DIAGNOSTIC
Anesthesia: General | Site: Abdomen

## 2023-11-02 MED ORDER — LIFITEGRAST 5 % OP SOLN
1.0000 [drp] | Freq: Every morning | OPHTHALMIC | Status: DC
  Administered 2023-11-03 – 2023-11-05 (×3): 1 [drp] via OPHTHALMIC

## 2023-11-02 MED ORDER — SODIUM CHLORIDE (PF) 0.9 % IJ SOLN
INTRAMUSCULAR | Status: AC
  Filled 2023-11-02: qty 20

## 2023-11-02 MED ORDER — OXYCODONE HCL 5 MG PO TABS: 5.0000 mg | ORAL_TABLET | Freq: Once | ORAL | Status: DC | PRN

## 2023-11-02 MED ORDER — DEXAMETHASONE SODIUM PHOSPHATE 10 MG/ML IJ SOLN
4.0000 mg | INTRAMUSCULAR | Status: AC
  Administered 2023-11-02: 10 mg via INTRAVENOUS

## 2023-11-02 MED ORDER — SIMETHICONE 80 MG PO CHEW: 80.0000 mg | CHEWABLE_TABLET | Freq: Four times a day (QID) | ORAL | Status: DC | PRN

## 2023-11-02 MED ORDER — BUPIVACAINE LIPOSOME 1.3 % IJ SUSP
INTRAMUSCULAR | Status: DC | PRN
  Administered 2023-11-02: 20 mL

## 2023-11-02 MED ORDER — MIDAZOLAM HCL 2 MG/2ML IJ SOLN
INTRAMUSCULAR | Status: AC
  Filled 2023-11-02: qty 2

## 2023-11-02 MED ORDER — ONDANSETRON HCL 4 MG/2ML IJ SOLN
INTRAMUSCULAR | Status: AC
  Filled 2023-11-02: qty 2

## 2023-11-02 MED ORDER — POVIDONE-IODINE 10 % EX SWAB: 2.0000 | Freq: Once | CUTANEOUS | Status: DC

## 2023-11-02 MED ORDER — BUPIVACAINE HCL 0.25 % IJ SOLN
INTRAMUSCULAR | Status: DC | PRN
  Administered 2023-11-02: 27 mL

## 2023-11-02 MED ORDER — BUPIVACAINE LIPOSOME 1.3 % IJ SUSP
INTRAMUSCULAR | Status: AC
  Filled 2023-11-02: qty 20

## 2023-11-02 MED ORDER — KCL IN DEXTROSE-NACL 20-5-0.45 MEQ/L-%-% IV SOLN
INTRAVENOUS | Status: DC
  Filled 2023-11-02: qty 1000

## 2023-11-02 MED ORDER — OXYCODONE HCL 5 MG/5ML PO SOLN: 5.0000 mg | Freq: Once | ORAL | Status: DC | PRN

## 2023-11-02 MED ORDER — FENTANYL CITRATE (PF) 100 MCG/2ML IJ SOLN
INTRAMUSCULAR | Status: AC
  Filled 2023-11-02: qty 2

## 2023-11-02 MED ORDER — AMISULPRIDE (ANTIEMETIC) 5 MG/2ML IV SOLN: 10.0000 mg | Freq: Once | INTRAVENOUS | Status: DC | PRN

## 2023-11-02 MED ORDER — ROCURONIUM BROMIDE 10 MG/ML (PF) SYRINGE
PREFILLED_SYRINGE | INTRAVENOUS | Status: AC
  Filled 2023-11-02: qty 10

## 2023-11-02 MED ORDER — ONDANSETRON HCL 4 MG PO TABS: 4.0000 mg | ORAL_TABLET | Freq: Four times a day (QID) | ORAL | Status: DC | PRN

## 2023-11-02 MED ORDER — HYDROMORPHONE HCL 1 MG/ML IJ SOLN: 0.2500 mg | INTRAMUSCULAR | Status: DC | PRN

## 2023-11-02 MED ORDER — ALBUMIN HUMAN 5 % IV SOLN
INTRAVENOUS | Status: AC
  Filled 2023-11-02: qty 250

## 2023-11-02 MED ORDER — ONDANSETRON HCL 4 MG/2ML IJ SOLN
4.0000 mg | Freq: Four times a day (QID) | INTRAMUSCULAR | Status: DC | PRN
  Administered 2023-11-02 – 2023-11-05 (×2): 4 mg via INTRAVENOUS
  Filled 2023-11-02 (×2): qty 2

## 2023-11-02 MED ORDER — ENOXAPARIN SODIUM 40 MG/0.4ML IJ SOSY
40.0000 mg | PREFILLED_SYRINGE | INTRAMUSCULAR | Status: DC
  Administered 2023-11-03 – 2023-11-05 (×3): 40 mg via SUBCUTANEOUS
  Filled 2023-11-02 (×3): qty 0.4

## 2023-11-02 MED ORDER — POLYVINYL ALCOHOL 1.4 % OP SOLN: Freq: Three times a day (TID) | OPHTHALMIC | Status: DC | PRN

## 2023-11-02 MED ORDER — ONDANSETRON HCL 4 MG/2ML IJ SOLN
INTRAMUSCULAR | Status: DC | PRN
  Administered 2023-11-02: 4 mg via INTRAVENOUS

## 2023-11-02 MED ORDER — ROCURONIUM BROMIDE 10 MG/ML (PF) SYRINGE
PREFILLED_SYRINGE | INTRAVENOUS | Status: DC | PRN
Start: 2023-11-02 — End: 2023-11-02
  Administered 2023-11-02: 20 mg via INTRAVENOUS
  Administered 2023-11-02: 100 mg via INTRAVENOUS

## 2023-11-02 MED ORDER — FERROUS SULFATE 325 (65 FE) MG PO TABS
325.0000 mg | ORAL_TABLET | Freq: Every day | ORAL | Status: DC
  Administered 2023-11-03 – 2023-11-05 (×3): 325 mg via ORAL
  Filled 2023-11-02 (×3): qty 1

## 2023-11-02 MED ORDER — ACETAMINOPHEN 500 MG PO TABS
1000.0000 mg | ORAL_TABLET | Freq: Four times a day (QID) | ORAL | Status: DC
  Administered 2023-11-02: 1000 mg via ORAL
  Filled 2023-11-02 (×5): qty 2

## 2023-11-02 MED ORDER — SODIUM CHLORIDE (PF) 0.9 % IJ SOLN
INTRAMUSCULAR | Status: AC
  Filled 2023-11-02: qty 10

## 2023-11-02 MED ORDER — HYDROMORPHONE HCL 2 MG/ML IJ SOLN
INTRAMUSCULAR | Status: AC
  Filled 2023-11-02: qty 1

## 2023-11-02 MED ORDER — HYDROMORPHONE HCL 1 MG/ML IJ SOLN
INTRAMUSCULAR | Status: DC | PRN
  Administered 2023-11-02: 1 mg via INTRAVENOUS

## 2023-11-02 MED ORDER — STERILE WATER FOR IRRIGATION IR SOLN
Status: DC | PRN
  Administered 2023-11-02: 1000 mL

## 2023-11-02 MED ORDER — PHENYLEPHRINE 80 MCG/ML (10ML) SYRINGE FOR IV PUSH (FOR BLOOD PRESSURE SUPPORT)
PREFILLED_SYRINGE | INTRAVENOUS | Status: DC | PRN
Start: 2023-11-02 — End: 2023-11-02
  Administered 2023-11-02 (×8): 160 ug via INTRAVENOUS

## 2023-11-02 MED ORDER — VASOPRESSIN 20 UNIT/ML IV SOLN
INTRAVENOUS | Status: AC
  Filled 2023-11-02: qty 1

## 2023-11-02 MED ORDER — BUPIVACAINE HCL (PF) 0.25 % IJ SOLN
INTRAMUSCULAR | Status: AC
  Filled 2023-11-02: qty 30

## 2023-11-02 MED ORDER — LACTATED RINGERS IR SOLN
Status: DC | PRN
  Administered 2023-11-02: 1000 mL

## 2023-11-02 MED ORDER — KETAMINE HCL 50 MG/5ML IJ SOSY
PREFILLED_SYRINGE | INTRAMUSCULAR | Status: DC | PRN
  Administered 2023-11-02: 30 mg via INTRAVENOUS

## 2023-11-02 MED ORDER — PHENYLEPHRINE HCL-NACL 20-0.9 MG/250ML-% IV SOLN: INTRAVENOUS | Status: DC | PRN

## 2023-11-02 MED ORDER — LIDOCAINE HCL (CARDIAC) PF 100 MG/5ML IV SOSY
PREFILLED_SYRINGE | INTRAVENOUS | Status: DC | PRN
  Administered 2023-11-02: 60 mg via INTRAVENOUS

## 2023-11-02 MED ORDER — ALBUTEROL SULFATE (2.5 MG/3ML) 0.083% IN NEBU: 3.0000 mL | INHALATION_SOLUTION | Freq: Four times a day (QID) | RESPIRATORY_TRACT | Status: DC | PRN

## 2023-11-02 MED ORDER — ALBUMIN HUMAN 5 % IV SOLN
INTRAVENOUS | Status: AC
  Filled 2023-11-02: qty 500

## 2023-11-02 MED ORDER — KETAMINE HCL 50 MG/5ML IJ SOSY
PREFILLED_SYRINGE | INTRAMUSCULAR | Status: AC
  Filled 2023-11-02: qty 5

## 2023-11-02 MED ORDER — TRAMADOL HCL 50 MG PO TABS
50.0000 mg | ORAL_TABLET | Freq: Four times a day (QID) | ORAL | Status: DC | PRN
  Administered 2023-11-03: 50 mg via ORAL
  Filled 2023-11-02: qty 1

## 2023-11-02 MED ORDER — HEMOSTATIC AGENTS (NO CHARGE) OPTIME
TOPICAL | Status: DC | PRN
  Administered 2023-11-02: 1 via TOPICAL

## 2023-11-02 MED ORDER — PROPOFOL 10 MG/ML IV BOLUS
INTRAVENOUS | Status: AC
  Filled 2023-11-02: qty 20

## 2023-11-02 MED ORDER — ATORVASTATIN CALCIUM 20 MG PO TABS
20.0000 mg | ORAL_TABLET | Freq: Every day | ORAL | Status: DC
  Administered 2023-11-02 – 2023-11-03 (×2): 20 mg via ORAL
  Filled 2023-11-02 (×2): qty 1

## 2023-11-02 MED ORDER — LACTATED RINGERS IV SOLN: INTRAVENOUS | Status: DC

## 2023-11-02 MED ORDER — PHENYLEPHRINE 80 MCG/ML (10ML) SYRINGE FOR IV PUSH (FOR BLOOD PRESSURE SUPPORT)
PREFILLED_SYRINGE | INTRAVENOUS | Status: AC
  Filled 2023-11-02: qty 10

## 2023-11-02 MED ORDER — SODIUM CHLORIDE 0.9 % IV SOLN: 10.0000 mL/h | Freq: Once | INTRAVENOUS | Status: AC

## 2023-11-02 MED ORDER — MIDAZOLAM HCL 5 MG/5ML IJ SOLN
INTRAMUSCULAR | Status: DC | PRN
  Administered 2023-11-02: 2 mg via INTRAVENOUS

## 2023-11-02 MED ORDER — LIDOCAINE HCL (PF) 2 % IJ SOLN
INTRAMUSCULAR | Status: AC
  Filled 2023-11-02: qty 5

## 2023-11-02 MED ORDER — POTASSIUM CHLORIDE 10 MEQ/100ML IV SOLN
10.0000 meq | INTRAVENOUS | Status: AC
  Administered 2023-11-02 (×2): 10 meq via INTRAVENOUS
  Filled 2023-11-02: qty 100

## 2023-11-02 MED ORDER — HEPARIN SODIUM (PORCINE) 5000 UNIT/ML IJ SOLN
5000.0000 [IU] | INTRAMUSCULAR | Status: AC
  Administered 2023-11-02: 5000 [IU] via SUBCUTANEOUS
  Filled 2023-11-02: qty 1

## 2023-11-02 MED ORDER — METRONIDAZOLE 500 MG/100ML IV SOLN
500.0000 mg | INTRAVENOUS | Status: AC
  Administered 2023-11-02: 500 mg via INTRAVENOUS
  Filled 2023-11-02: qty 100

## 2023-11-02 MED ORDER — CHLORHEXIDINE GLUCONATE 0.12 % MT SOLN
15.0000 mL | Freq: Once | OROMUCOSAL | Status: AC
  Administered 2023-11-02: 15 mL via OROMUCOSAL

## 2023-11-02 MED ORDER — FENTANYL CITRATE PF 50 MCG/ML IJ SOSY
PREFILLED_SYRINGE | INTRAMUSCULAR | Status: AC
  Filled 2023-11-02: qty 2

## 2023-11-02 MED ORDER — EPHEDRINE SULFATE (PRESSORS) 50 MG/ML IJ SOLN
INTRAMUSCULAR | Status: DC | PRN
  Administered 2023-11-02: 2.5 mg via INTRAVENOUS

## 2023-11-02 MED ORDER — DEXAMETHASONE SODIUM PHOSPHATE 10 MG/ML IJ SOLN
INTRAMUSCULAR | Status: AC
  Filled 2023-11-02: qty 1

## 2023-11-02 MED ORDER — CEFAZOLIN SODIUM-DEXTROSE 2-4 GM/100ML-% IV SOLN
2.0000 g | INTRAVENOUS | Status: AC
  Administered 2023-11-02: 2 g via INTRAVENOUS
  Filled 2023-11-02: qty 100

## 2023-11-02 MED ORDER — FENTANYL CITRATE (PF) 100 MCG/2ML IJ SOLN
INTRAMUSCULAR | Status: DC | PRN
  Administered 2023-11-02: 100 ug via INTRAVENOUS

## 2023-11-02 MED ORDER — FENTANYL CITRATE PF 50 MCG/ML IJ SOSY
25.0000 ug | PREFILLED_SYRINGE | INTRAMUSCULAR | Status: DC | PRN
  Administered 2023-11-02: 50 ug via INTRAVENOUS

## 2023-11-02 MED ORDER — ALBUMIN HUMAN 5 % IV SOLN: INTRAVENOUS | Status: DC | PRN

## 2023-11-02 MED ORDER — PHENYLEPHRINE HCL-NACL 20-0.9 MG/250ML-% IV SOLN
INTRAVENOUS | Status: DC | PRN
  Administered 2023-11-02: 40 ug/min via INTRAVENOUS

## 2023-11-02 MED ORDER — ACETAMINOPHEN 500 MG PO TABS
1000.0000 mg | ORAL_TABLET | ORAL | Status: AC
  Administered 2023-11-02: 1000 mg via ORAL
  Filled 2023-11-02: qty 2

## 2023-11-02 MED ORDER — OXYCODONE HCL 5 MG PO TABS: 5.0000 mg | ORAL_TABLET | ORAL | Status: DC | PRN

## 2023-11-02 MED ORDER — SENNOSIDES-DOCUSATE SODIUM 8.6-50 MG PO TABS
2.0000 | ORAL_TABLET | Freq: Every day | ORAL | Status: DC
  Administered 2023-11-02 – 2023-11-04 (×3): 2 via ORAL
  Filled 2023-11-02 (×3): qty 2

## 2023-11-02 MED ORDER — SUGAMMADEX SODIUM 200 MG/2ML IV SOLN
INTRAVENOUS | Status: DC | PRN
  Administered 2023-11-02: 300 mg via INTRAVENOUS

## 2023-11-02 MED ORDER — PROPOFOL 10 MG/ML IV BOLUS
INTRAVENOUS | Status: DC | PRN
  Administered 2023-11-02: 200 mg via INTRAVENOUS

## 2023-11-02 SURGICAL SUPPLY — 88 items
APPLICATOR ARISTA FLEXITIP XL (MISCELLANEOUS) IMPLANT
APPLICATOR SURGIFLO ENDO (HEMOSTASIS) IMPLANT
BAG COUNTER SPONGE SURGICOUNT (BAG) IMPLANT
BAG LAPAROSCOPIC 12 15 PORT 16 (BASKET) IMPLANT
BLADE SURG SZ10 CARB STEEL (BLADE) IMPLANT
CABLE HIGH FREQUENCY MONO STRZ (ELECTRODE) IMPLANT
CHLORAPREP W/TINT 26 (MISCELLANEOUS) ×2 IMPLANT
CNTNR URN SCR LID CUP LEK RST (MISCELLANEOUS) IMPLANT
COVER BACK TABLE 60X90IN (DRAPES) ×2 IMPLANT
COVER SURGICAL LIGHT HANDLE (MISCELLANEOUS) ×2 IMPLANT
COVER TIP SHEARS 8 DVNC (MISCELLANEOUS) ×2 IMPLANT
DERMABOND ADVANCED .7 DNX12 (GAUZE/BANDAGES/DRESSINGS) ×2 IMPLANT
DERMABOND ADVANCED .7 DNX6 (GAUZE/BANDAGES/DRESSINGS) IMPLANT
DRAIN CHANNEL 10F 3/8 F FF (DRAIN) IMPLANT
DRAPE ARM DVNC X/XI (DISPOSABLE) ×8 IMPLANT
DRAPE COLUMN DVNC XI (DISPOSABLE) ×2 IMPLANT
DRAPE SHEET LG 3/4 BI-LAMINATE (DRAPES) ×2 IMPLANT
DRAPE SURG IRRIG POUCH 19X23 (DRAPES) ×2 IMPLANT
DRIVER NDL MEGA SUTCUT DVNCXI (INSTRUMENTS) ×2 IMPLANT
DRIVER NDLE MEGA SUTCUT DVNCXI (INSTRUMENTS) ×2 IMPLANT
DRSG OPSITE POSTOP 4X6 (GAUZE/BANDAGES/DRESSINGS) IMPLANT
DRSG OPSITE POSTOP 4X8 (GAUZE/BANDAGES/DRESSINGS) IMPLANT
ELECT PENCIL ROCKER SW 15FT (MISCELLANEOUS) IMPLANT
ELECT REM PT RETURN 15FT ADLT (MISCELLANEOUS) ×2 IMPLANT
EVACUATOR SILICONE 100CC (DRAIN) IMPLANT
FORCEPS BPLR FENES DVNC XI (FORCEP) ×2 IMPLANT
FORCEPS PROGRASP DVNC XI (FORCEP) ×2 IMPLANT
GAUZE 4X4 16PLY ~~LOC~~+RFID DBL (SPONGE) ×4 IMPLANT
GLOVE BIO SURGEON STRL SZ 6 (GLOVE) ×8 IMPLANT
GLOVE BIO SURGEON STRL SZ 6.5 (GLOVE) ×4 IMPLANT
GOWN STRL REUS W/ TWL LRG LVL3 (GOWN DISPOSABLE) ×8 IMPLANT
GRASPER SUT TROCAR 14GX15 (MISCELLANEOUS) IMPLANT
HANDLE SUCTION POOLE (INSTRUMENTS) IMPLANT
HEMOSTAT ARISTA ABSORB 3G PWDR (HEMOSTASIS) IMPLANT
HOLDER FOLEY CATH W/STRAP (MISCELLANEOUS) IMPLANT
IRRIGATION SUCT STRKRFLW 2 WTP (MISCELLANEOUS) ×2 IMPLANT
KIT BASIN OR (CUSTOM PROCEDURE TRAY) ×2 IMPLANT
KIT PROCEDURE DVNC SI (MISCELLANEOUS) IMPLANT
KIT TURNOVER KIT A (KITS) ×2 IMPLANT
LIGASURE IMPACT 36 18CM CVD LR (INSTRUMENTS) IMPLANT
MANIPULATOR ADVINCU DEL 3.0 PL (MISCELLANEOUS) IMPLANT
MANIPULATOR ADVINCU DEL 3.5 PL (MISCELLANEOUS) IMPLANT
MANIPULATOR UTERINE 4.5 ZUMI (MISCELLANEOUS) IMPLANT
NDL HYPO 21X1.5 SAFETY (NEEDLE) ×2 IMPLANT
NDL SPNL 18GX3.5 QUINCKE PK (NEEDLE) IMPLANT
NEEDLE HYPO 21X1.5 SAFETY (NEEDLE) ×2 IMPLANT
NEEDLE SPNL 18GX3.5 QUINCKE PK (NEEDLE) IMPLANT
OBTURATOR OPTICALSTD 8 DVNC (TROCAR) ×2 IMPLANT
PACK ROBOT GYN CUSTOM WL (TRAY / TRAY PROCEDURE) ×2 IMPLANT
PAD POSITIONING PINK XL (MISCELLANEOUS) ×2 IMPLANT
PORT ACCESS TROCAR AIRSEAL 12 (TROCAR) IMPLANT
SCISSORS LAP 5X35 DISP (ENDOMECHANICALS) IMPLANT
SCISSORS MNPLR CVD DVNC XI (INSTRUMENTS) ×2 IMPLANT
SCRUB CHG 4% DYNA-HEX 4OZ (MISCELLANEOUS) IMPLANT
SEAL UNIV 5-12 XI (MISCELLANEOUS) ×8 IMPLANT
SEALER VESSEL EXT DVNC XI (MISCELLANEOUS) IMPLANT
SET TRI-LUMEN FLTR TB AIRSEAL (TUBING) ×2 IMPLANT
SHEET LAVH (DRAPES) ×2 IMPLANT
SLEEVE Z-THREAD 5X100MM (TROCAR) ×2 IMPLANT
SPIKE FLUID TRANSFER (MISCELLANEOUS) ×2 IMPLANT
SPONGE T-LAP 18X18 ~~LOC~~+RFID (SPONGE) IMPLANT
SURGIFLO W/THROMBIN 8M KIT (HEMOSTASIS) IMPLANT
SUT MNCRL AB 4-0 PS2 18 (SUTURE) ×4 IMPLANT
SUT PDS AB 1 TP1 96 (SUTURE) IMPLANT
SUT STRATA PDS 0 30 CT-2.5 (SUTURE) IMPLANT
SUT V-LOC 180 0-0 GS22 (SUTURE) IMPLANT
SUT VIC AB 0 CT1 27XBRD ANTBC (SUTURE) IMPLANT
SUT VIC AB 2-0 CT1 TAPERPNT 27 (SUTURE) IMPLANT
SUT VIC AB 2-0 SH 18 (SUTURE) IMPLANT
SUT VIC AB 2-0 SH 27X BRD (SUTURE) IMPLANT
SUT VIC AB 4-0 PS2 18 (SUTURE) ×4 IMPLANT
SUT VICRYL 0 27 CT2 27 ABS (SUTURE) ×2 IMPLANT
SUT VLOC 180 0 9IN GS21 (SUTURE) IMPLANT
SYR 10ML LL (SYRINGE) IMPLANT
SYSTEM BAG RETRIEVAL 10MM (BASKET) IMPLANT
SYSTEM RETRIEVL 5MM INZII UNIV (BASKET) IMPLANT
SYSTEM WOUND ALEXIS 18CM MED (MISCELLANEOUS) IMPLANT
TOWEL OR 17X26 10 PK STRL BLUE (TOWEL DISPOSABLE) ×2 IMPLANT
TRAP SPECIMEN MUCUS 40CC (MISCELLANEOUS) IMPLANT
TRAY FOLEY MTR SLVR 16FR STAT (SET/KITS/TRAYS/PACK) ×2 IMPLANT
TRAY LAPAROSCOPIC (CUSTOM PROCEDURE TRAY) ×2 IMPLANT
TROCAR ADV FIXATION 12X100MM (TROCAR) IMPLANT
TROCAR BALLN 12MMX100 BLUNT (TROCAR) IMPLANT
TROCAR PORT AIRSEAL 5X120 (TROCAR) IMPLANT
TROCAR Z-THREAD OPTICAL 5X100M (TROCAR) ×2 IMPLANT
UNDERPAD 30X36 HEAVY ABSORB (UNDERPADS AND DIAPERS) ×4 IMPLANT
WATER STERILE IRR 1000ML POUR (IV SOLUTION) ×2 IMPLANT
YANKAUER SUCT BULB TIP 10FT TU (MISCELLANEOUS) IMPLANT

## 2023-11-02 NOTE — Op Note (Signed)
 OPERATIVE NOTE  Pre-operative Diagnosis: Uterine mass  Post-operative Diagnosis: same, at least stage III uterine cancer  Operation: Robotic-assisted laparoscopic total hysterectomy with bilateral salpingo-oophorectomy (uterus > 250 gms), laparotomy for specimen removal, resection of omentum adherent to the cecum  Surgeon: Viktoria Crank MD  Assistant Surgeon: Eleanor Epps, NP (an NP assistant was necessary for tissue manipulation, management of robotic instrumentation, retraction and positioning due to the complexity of the case and hospital policies).   Anesthesia: GET  Urine Output: 1200 cc  Operative Findings:  On EUA, enlarged mobile uterus.  On intra-abdominal examination, normal upper abdominal survey.  Large, approximately 15-16 cm mass arising from the uterine fundus.  Lower uterine segment itself is normal in appearance.  Significant enlargement of ovarian vasculature as well as enlarged uterine arteries.  Normal-appearing bilateral tubes and ovaries.  No ascites.  No obvious pelvic adenopathy.  Only after a portion of the hysterectomy had been completed was not apparent that there was some tumor perforating through a small, less than 1 cm area at the superior right aspect of the uterine mass with adjacent tumor involving omentum that was adherent to the cecum.  Otherwise, omentum with several small areas of thickened tissue, no visible abnormalities.  Small bowel run from the cecum to the ligament of Treitz with no significant findings.  No gross disease at the end of surgery. 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  Estimated Blood Loss:  250 cc      Total IV Fluids: see I&O flowsheet, 2 u pRBCs         Specimens: uterus, cervix, bilateral tubes and ovaries, peri-cecal nodule, omentum         Complications:  None apparent; patient tolerated the procedure well.         Disposition: PACU -  hemodynamically stable.  Procedure Details  The patient was seen in the Holding Room. The risks, benefits, complications, treatment options, and expected outcomes were discussed with the patient.  The patient concurred with the proposed plan, giving informed consent.  The site of surgery properly noted/marked. The patient was identified as Kristin [name] and the procedure verified as a Robotic-assisted versus open hysterectomy with bilateral salpingo oophorectomy, possible debulking.   After induction of anesthesia, the patient was draped and prepped in the usual sterile manner. Patient was placed in supine position after anesthesia and draped and prepped in the usual sterile manner as follows: Her arms were tucked to her side with all appropriate precautions.  The patient was secured to the bed using padding and tape across her chest.  The patient was placed in the semi-lithotomy position in Eagle stirrups.  The perineum and vagina were prepped with Betadine. The patient's abdomen was prepped with ChloraPrep and then she was draped after the prep had been allowed to dry for 3 minutes.  A Time Out was held and the above information confirmed.  The urethra was prepped with Betadine. Foley catheter was placed.  OG tube placement was confirmed and to suction.   Next, a 5 mm skin incision was made 1 cm below the subcostal margin in the midclavicular line.  The 5 mm Optiview port and scope was used for direct entry.  Opening pressure was under 10 mm CO2.  The abdomen was insufflated and the findings were noted as above.  Given thin lower uterine segment, robotic hysterectomy seemed feasible and decision made to proceed with placement of other ports.  At this point and all points during the procedure, the patient's intra-abdominal pressure did not exceed 15 mmHg. Next, a 12 mm skin incision was made superior to the umbilicus and a right and left 8 mm port were placed about 8 cm lateral to the robot port on the  right and left side.  A fourth arm was placed on the right.  The 5 mm assist trocar was exchanged for a 5 mm airseal port. All ports were placed under direct visualization.  The patient was placed in steep Trendelenburg.  The robot was docked in the normal manner.  A sterile speculum was placed in the vagina.  The cervix was grasped with a single-tooth tenaculum. The cervix was dilated with Fredirick dilators.  The 3.0 cm Advincula uterine manipulator with colpotomizer ring was placed without difficulty.  A pneum occluder balloon was placed over the manipulator.    The right and left peritoneum were opened parallel to the IP ligament to open the retroperitoneal spaces bilaterally. The round ligaments were transected. The ureter was noted to be on the medial leaf of the broad ligament.  The peritoneum above the ureter was incised and stretched and the infundibulopelvic ligament was skeletonized, cauterized and cut.  Given very large vessels within these pedicles, vessel sealer was also used after the vessels had been cauterized using bipolar graspers.  With manipulation of the uterus over to the right, the area of tumor that appeared to have perforated the superior right aspect of the uterine mass became evident with some omentum adherent to the cecum in this area.  The posterior peritoneum was taken down to the level of the KOH ring.  The anterior peritoneum was also taken down.  The bladder flap was created to the level of the KOH ring.  The uterine artery on the right side was skeletonized, cauterized and cut in the normal manner.  A similar procedure was performed on the left.  The colpotomy was made and the uterus, cervix, bilateral ovaries and tubes were amputated Pedicles were inspected and excellent hemostasis was achieved.    The colpotomy at the vaginal cuff was closed with 0 Vicryl figure-of-eight at each apex and 0 V-Loc to close the midportion of the cuff in a running manner. Irrigation was used  and excellent hemostasis was achieved.    Robotic instruments were removed under direct visulaization.  The robot was undocked.   The supraumbilical trocar was removed and the incision extended 16-18 cm with a scalpel. The incision was carried down to and through the fascia, with the abdomen insufflated, using monopolar electrocautery. The peritoneal incision was extended under direct visualization. The uterine specimen was delivered through the incision.  She was were taken and placed in media and the specimen sent to pathology for frozen section.  Attention was then turned to the cecum.  There was what appeared to be necrotic tissue versus tumor adherent to some omentum adjacent to the cecum.  This was removed and sent for frozen section as well.  The omentum was then carefully divided free from the cecum itself with monopolar electrocautery and sharp dissection.  The area of involved and thickened omentum was excised using monopolar electrocautery.  The area of raw omentum just adjacent to the cecum was oversewn with running 2-0 Vicryl.  The transverse colon was examined and another area of thickened omentum was identified and resected using accommodation of monopolar electrocautery as well as suture ligation with 2-0 Vicryl.  The small bowel was run from  the cecum to the ligament of Treitz with no disease noted.  The pelvis and abdomen were irrigated copiously after the abdomen was entirely explored.  Some Floseal and Arista were placed on the pelvic surgical beds given enlarged vasculature.  At this point frozen section came back and no additional procedures were indicated.  The incision was then closed with running #1 looped PDS tied in the midline.  During closure of the fascia, it was noted that we were missing a Ray-Tec.  We ultimately obtained an x-ray before the abdominal incision was completely closed showing no intra-abdominal Ray-Tec.  The Ray-Tec was subsequently found underneath the  antifatigue mat.  The subcutaneous tissue was irrigated and hemostasis achieved. Exparel was injected for local anesthesia. The subcutaneous tissue was closed with 2-0 Vicyrl in running fashion.   The subcuticular tissue of all incisions was closed with 4-0 Vicryl and the skin was closed with 4-0 Monocryl in a subcuticular manner.  Dermabond was applied.    The vagina was swabbed with  minimal bleeding noted. Foley catheter was emptied.  All sponge, lap and needle counts were correct x  3 (see above note).   The patient was transferred to the recovery room in stable condition.  Comer Dollar, MD

## 2023-11-02 NOTE — Anesthesia Procedure Notes (Signed)
 Arterial Line Insertion Start/End7/06/2023 2:53 PM, 11/02/2023 2:53 PM Performed by: Merla Almarie HERO, DO, anesthesiologist  Patient location: Pre-op. Preanesthetic checklist: patient identified, IV checked, site marked, risks and benefits discussed, surgical consent, monitors and equipment checked, pre-op evaluation, timeout performed and anesthesia consent Lidocaine  1% used for infiltration Left, radial was placed Catheter size: 20 G Hand hygiene performed  and maximum sterile barriers used   Attempts: 2 Procedure performed without using ultrasound guided technique. Following insertion, dressing applied. Post procedure assessment: normal and unchanged  Patient tolerated the procedure well with no immediate complications.

## 2023-11-02 NOTE — Discharge Instructions (Signed)
 AFTER SURGERY INSTRUCTIONS   Return to work: 4-6 weeks if applicable  You will need to be on a blood thinner after surgery to prevent blood clots for 2 weeks.   PLAN ON STARTING ELIQUIS (BLOOD THINNER) the day after leaving the hospital if you received the blood thinner injection lovenox while at the hospital before you were discharged.  YOU WILL TAKE ELIQUIS TWICE DAILY. THIS IS A BLOOD THINNER TO PREVENT BLOOD CLOTS SO YOU WILL NEED TO MONITOR FOR BLEEDING AND CALL THE OFFICE.  AVOID USE OF NSAIDS (IBUPROFEN , NAPROXEN) WHILE TAKING THE BLOOD THINNER.    Activity: 1. Be up and out of the bed during the day.  Take a nap if needed.  You may walk up steps but be careful and use the hand rail.  Stair climbing will tire you more than you think, you may need to stop part way and rest.    2. No lifting or straining for 6 weeks over 10 pounds. No pushing, pulling, straining for 6 weeks.   3. No driving for 4-89 days when the following criteria have been met: Do not drive if you are taking narcotic pain medicine and make sure that your reaction time has returned.    4. You can shower as soon as the next day after surgery. Shower daily.  Use your regular soap and water (not directly on the incision) and pat your incision(s) dry afterwards; don't rub.  No tub baths or submerging your body in water until cleared by your surgeon. If you have the soap that was given to you by pre-surgical testing that was used before surgery, you do not need to use it afterwards because this can irritate your incisions.    5. No sexual activity and nothing in the vagina for 12 weeks if you have a hysterectomy.   6. You may experience a small amount of clear drainage from your incisions, which is normal.  If the drainage persists, increases, or changes color please call the office.   7. Do not use creams, lotions, or ointments such as neosporin on your incisions after surgery until advised by your surgeon because they can  cause removal of the dermabond glue on your incisions.     8. You may experience vaginal spotting after surgery or when the stitches at the top of the vagina begin to dissolve.  The spotting is normal but if you experience heavy bleeding, call our office.   9. Take Tylenol  first for pain if you are able to take these medications and only use narcotic pain medication for severe pain not relieved by the Tylenol .  Monitor your Tylenol  intake to a max of 4,000 mg in a 24 hour period.    Diet: 1. Low sodium Heart Healthy Diet is recommended but you are cleared to resume your normal (before surgery) diet after your procedure.   2. It is safe to use a laxative, such as Miralax or Colace, if you have difficulty moving your bowels before surgery. You have been prescribed Sennakot-S to take at bedtime every evening after surgery to keep bowel movements regular and to prevent constipation.     Wound Care: 1. Keep clean and dry.  Shower daily.   Reasons to call the Doctor: Fever - Oral temperature greater than 100.4 degrees Fahrenheit Foul-smelling vaginal discharge Difficulty urinating Nausea and vomiting Increased pain at the site of the incision that is unrelieved with pain medicine. Difficulty breathing with or without chest pain New calf pain  especially if only on one side Sudden, continuing increased vaginal bleeding with or without clots.   Contacts: For questions or concerns you should contact:   Dr. Comer Dollar at [phone]   Eleanor Epps, NP at [phone]   After Hours: call [phone] and have the GYN Oncologist paged/contacted (after 5 pm or on the weekends). You will speak with an after hours RN and let he or she know you have had surgery.   Messages sent via mychart are for non-urgent matters and are not responded to after hours so for urgent needs, please call the after hours number.

## 2023-11-02 NOTE — Anesthesia Preprocedure Evaluation (Addendum)
 Anesthesia Evaluation  Patient identified by MRN, date of birth, ID band Patient awake    Reviewed: Allergy & Precautions, NPO status , Patient's Chart, lab work & pertinent test results  Airway Mallampati: III  TM Distance: >3 FB Neck ROM: Full    Dental  (+) Teeth Intact, Dental Advisory Given   Pulmonary asthma (well controlled)  Snores at night   Pulmonary exam normal breath sounds clear to auscultation       Cardiovascular hypertension (159/76 preop), Normal cardiovascular exam Rhythm:Regular Rate:Normal     Neuro/Psych negative neurological ROS  negative psych ROS   GI/Hepatic Neg liver ROS,GERD  Controlled,,  Endo/Other  negative endocrine ROS    Renal/GU negative Renal ROS  negative genitourinary   Musculoskeletal  (+) Arthritis , Osteoarthritis,    Abdominal   Peds  Hematology  (+) Blood dyscrasia, anemia Hb 8.2, plt 847   Anesthesia Other Findings   Reproductive/Obstetrics Uterine mass                              Anesthesia Physical Anesthesia Plan  ASA: 3  Anesthesia Plan: General   Post-op Pain Management: Tylenol  PO (pre-op)*, Ketamine IV* and Dilaudid  IV   Induction: Intravenous  PONV Risk Score and Plan: 4 or greater and Ondansetron , Dexamethasone , Midazolam  and Treatment may vary due to age or medical condition  Airway Management Planned: Oral ETT  Additional Equipment: Arterial line  Intra-op Plan:   Post-operative Plan: Extubation in OR  Informed Consent: I have reviewed the patients History and Physical, chart, labs and discussed the procedure including the risks, benefits and alternatives for the proposed anesthesia with the patient or authorized representative who has indicated his/her understanding and acceptance.     Dental advisory given  Plan Discussed with: CRNA  Anesthesia Plan Comments: (Starting hb 8.2, cross x 2 units 2 large bore  PIVs Arterial line)         Anesthesia Quick Evaluation

## 2023-11-02 NOTE — Anesthesia Procedure Notes (Signed)
 Procedure Name: Intubation Date/Time: 11/02/2023 2:43 PM  Performed by: Christopher Comings, CRNAPre-anesthesia Checklist: Patient identified, Emergency Drugs available, Suction available and Patient being monitored Patient Re-evaluated:Patient Re-evaluated prior to induction Oxygen Delivery Method: Circle system utilized Preoxygenation: Pre-oxygenation with 100% oxygen Induction Type: IV induction Ventilation: Mask ventilation without difficulty Laryngoscope Size: Mac and 4 Grade View: Grade I Tube type: Oral Tube size: 7.0 mm Number of attempts: 1 Airway Equipment and Method: Stylet and Oral airway Placement Confirmation: ETT inserted through vocal cords under direct vision, positive ETCO2 and breath sounds checked- equal and bilateral Secured at: 22 cm Tube secured with: Tape Dental Injury: Teeth and Oropharynx as per pre-operative assessment

## 2023-11-02 NOTE — Interval H&P Note (Signed)
 History and Physical Interval Note:  11/02/2023 1:45 PM  Kristin [name] Lesch  has presented today for surgery, with the diagnosis of UTERINE MASS.  The various methods of treatment have been discussed with the patient and family. After consideration of risks, benefits and other options for treatment, the patient has consented to  Procedure(s) with comments: LAPAROSCOPY, DIAGNOSTIC (N/A) HYSTERECTOMY, TOTAL, ROBOT-ASSISTED, LAPAROSCOPIC, WITH BILATERAL SALPINGO-OOPHORECTOMY (N/A) - ROBOTIC ASSISTED VERSUS OPEN TOTAL HYST, BSO, POSSIBLE DEBULKING DEBULKING, ABDOMINAL (N/A) - POSSIBLE DEBULKING as a surgical intervention.  The patient's history has been reviewed, patient examined, no change in status, stable for surgery.  I have reviewed the patient's chart and labs.  Questions were answered to the patient's satisfaction.     [name]

## 2023-11-02 NOTE — Brief Op Note (Signed)
 11/02/2023  6:21 PM  PATIENT:  Kristin [name]  58 y.o. female  PRE-OPERATIVE DIAGNOSIS:  UTERINE MASS  POST-OPERATIVE DIAGNOSIS:  UTERINE MASS  PROCEDURE:  Procedure(s) with comments: LAPAROSCOPY, DIAGNOSTIC (N/A) HYSTERECTOMY, TOTAL, ROBOT-ASSISTED, LAPAROSCOPIC, WITH BILATERAL SALPINGO-OOPHORECTOMY (N/A) DEBULKING, ABDOMINAL (N/A) - POSSIBLE DEBULKING MINI LAPAROTOMY (N/A)  SURGEON:  Surgeons and Role:    DEWAINE Viktoria Comer JONELLE, MD - Primary  ASSISTANTS: Eleanor Epps, NP   ANESTHESIA:   general  EBL:  250 mL   BLOOD ADMINISTERED: 2u pRBCs  DRAINS: none   LOCAL MEDICATIONS USED:  exparel, 0.25% marcaie  SPECIMEN:  uterus, cervix, bilateral tubes and ovaries, omentum, peri-cecal nodule  DISPOSITION OF SPECIMEN:  PATHOLOGY  COUNTS:  YES  TOURNIQUET:  * No tourniquets in log *  DICTATION: .Note written in EPIC  PLAN OF CARE: Admit to inpatient   PATIENT DISPOSITION:  PACU - hemodynamically stable.   Delay start of Pharmacological VTE agent (>24hrs) due to surgical blood loss or risk of bleeding: no

## 2023-11-02 NOTE — Transfer of Care (Signed)
 Immediate Anesthesia Transfer of Care Note  Patient: Kristin [name]  Procedure(s) Performed: LAPAROSCOPY, DIAGNOSTIC (Abdomen) HYSTERECTOMY, TOTAL, ROBOT-ASSISTED, LAPAROSCOPIC, WITH BILATERAL SALPINGO-OOPHORECTOMY (Abdomen) DEBULKING, ABDOMINAL MINI LAPAROTOMY (Abdomen)  Patient Location: PACU  Anesthesia Type:General  Level of Consciousness: drowsy  Airway & Oxygen Therapy: Patient connected to face mask oxygen  Post-op Assessment: Report given to RN and Post -op Vital signs reviewed and stable  Post vital signs: Reviewed and stable  Last Vitals:  Vitals Value Taken Time  BP 126/74 11/02/23 18:45  Temp    Pulse 71 11/02/23 18:48  Resp 21 11/02/23 18:48  SpO2 100 % 11/02/23 18:48  Vitals shown include unfiled device data.  Last Pain:  Vitals:   11/02/23 1319  TempSrc:   PainSc: 0-No pain         Complications: There were no known notable events for this encounter.

## 2023-11-03 ENCOUNTER — Encounter (HOSPITAL_COMMUNITY): Payer: Self-pay | Admitting: Gynecologic Oncology

## 2023-11-03 LAB — POCT I-STAT 7, (LYTES, BLD GAS, ICA,H+H)
Acid-base deficit: 3 mmol/L — ABNORMAL HIGH (ref 0.0–2.0)
Acid-base deficit: 4 mmol/L — ABNORMAL HIGH (ref 0.0–2.0)
Acid-base deficit: 3 mmol/L — ABNORMAL HIGH (ref 0.0–2.0)
Bicarbonate: 22 mmol/L (ref 20.0–28.0)
Bicarbonate: 20.9 mmol/L (ref 20.0–28.0)
Bicarbonate: 21.8 mmol/L (ref 20.0–28.0)
Calcium, Ion: 1.17 mmol/L (ref 1.15–1.40)
Calcium, Ion: 1.1 mmol/L — ABNORMAL LOW (ref 1.15–1.40)
Calcium, Ion: 1.09 mmol/L — ABNORMAL LOW (ref 1.15–1.40)
HCT: 21 % — ABNORMAL LOW (ref 36.0–46.0)
HCT: 24 % — ABNORMAL LOW (ref 36.0–46.0)
HCT: 27 % — ABNORMAL LOW (ref 36.0–46.0)
Hemoglobin: 7.1 g/dL — ABNORMAL LOW (ref 12.0–15.0)
Hemoglobin: 8.2 g/dL — ABNORMAL LOW (ref 12.0–15.0)
Hemoglobin: 9.2 g/dL — ABNORMAL LOW (ref 12.0–15.0)
O2 Saturation: 100 %
O2 Saturation: 99 %
O2 Saturation: 100 %
Patient temperature: 35.7
Patient temperature: 35.2
Patient temperature: 35.5
Potassium: 2.8 mmol/L — ABNORMAL LOW (ref 3.5–5.1)
Potassium: 3.6 mmol/L (ref 3.5–5.1)
Potassium: 3.6 mmol/L (ref 3.5–5.1)
Sodium: 139 mmol/L (ref 135–145)
Sodium: 140 mmol/L (ref 135–145)
Sodium: 139 mmol/L (ref 135–145)
TCO2: 23 mmol/L (ref 22–32)
TCO2: 22 mmol/L (ref 22–32)
TCO2: 23 mmol/L (ref 22–32)
pCO2 arterial: 35.4 mmHg (ref 32–48)
pCO2 arterial: 33.2 mmHg (ref 32–48)
pCO2 arterial: 34.6 mmHg (ref 32–48)
pH, Arterial: 7.395 (ref 7.35–7.45)
pH, Arterial: 7.4 (ref 7.35–7.45)
pH, Arterial: 7.401 (ref 7.35–7.45)
pO2, Arterial: 224 mmHg — ABNORMAL HIGH (ref 83–108)
pO2, Arterial: 150 mmHg — ABNORMAL HIGH (ref 83–108)
pO2, Arterial: 201 mmHg — ABNORMAL HIGH (ref 83–108)

## 2023-11-03 LAB — BPAM RBC
Blood Product Expiration Date: 202508032359
Blood Product Expiration Date: 202508052359
ISSUE DATE / TIME: 202507021441
ISSUE DATE / TIME: 202507021441
Unit Type and Rh: 5100
Unit Type and Rh: 5100

## 2023-11-03 LAB — BASIC METABOLIC PANEL WITH GFR
Anion gap: 8 (ref 5–15)
BUN: <5 mg/dL — ABNORMAL LOW (ref 6–20)
CO2: 25 mmol/L (ref 22–32)
Calcium: 8.5 mg/dL — ABNORMAL LOW (ref 8.9–10.3)
Chloride: 104 mmol/L (ref 98–111)
Creatinine, Ser: 0.44 mg/dL (ref 0.44–1.00)
GFR, Estimated: >60 mL/min (ref >60)
Glucose, Bld: 136 mg/dL — ABNORMAL HIGH (ref 70–99)
Potassium: 3.6 mmol/L (ref 3.5–5.1)
Sodium: 137 mmol/L (ref 135–145)

## 2023-11-03 LAB — TYPE AND SCREEN
ABO/RH(D): O POS
Antibody Screen: NEGATIVE
Unit division: 0
Unit division: 0

## 2023-11-03 LAB — CBC
HCT: 35 % — ABNORMAL LOW (ref 36.0–46.0)
Hemoglobin: 10.5 g/dL — ABNORMAL LOW (ref 12.0–15.0)
MCH: 23.1 pg — ABNORMAL LOW (ref 26.0–34.0)
MCHC: 30 g/dL (ref 30.0–36.0)
MCV: 76.9 fL — ABNORMAL LOW (ref 80.0–100.0)
Platelets: 750 10*3/uL — ABNORMAL HIGH (ref 150–400)
RBC: 4.55 MIL/uL (ref 3.87–5.11)
RDW: 23.2 % — ABNORMAL HIGH (ref 11.5–15.5)
WBC: 11 10*3/uL — ABNORMAL HIGH (ref 4.0–10.5)
nRBC: 0 % (ref 0.0–0.2)

## 2023-11-03 MED ORDER — POLYETHYLENE GLYCOL 3350 17 G PO PACK
17.0000 g | PACK | Freq: Every day | ORAL | Status: DC
  Administered 2023-11-03 – 2023-11-05 (×3): 17 g via ORAL
  Filled 2023-11-03 (×3): qty 1

## 2023-11-03 MED ORDER — APIXABAN 2.5 MG PO TABS: 2.5000 mg | ORAL_TABLET | Freq: Two times a day (BID) | ORAL | 0 refills | Status: DC

## 2023-11-03 NOTE — Plan of Care (Signed)
 ?  Problem: Clinical Measurements: ?Goal: Will remain free from infection ?Outcome: Progressing ?  ?

## 2023-11-03 NOTE — Progress Notes (Signed)
 1 Day Post-Op Procedure(s) (LRB): LAPAROSCOPY, DIAGNOSTIC (N/A) HYSTERECTOMY, TOTAL, ROBOT-ASSISTED, LAPAROSCOPIC, WITH BILATERAL SALPINGO-OOPHORECTOMY (N/A) DEBULKING, ABDOMINAL (N/A) MINI LAPAROTOMY (N/A)  Subjective: Patient reports doing well. Minimal pain. Sat on edge of bed, has not ambulated yet. Tolerating liquids, has not eaten anything. Unsure if has had flatus.  Objective: Vital signs in last 24 hours: Temp:  [97 F (36.1 C)-98.7 F (37.1 C)] 97.6 F (36.4 C) (07/03 0934) Pulse Rate:  [71-119] 95 (07/03 0934) Resp:  [16-22] 16 (07/03 0934) BP: (109-159)/(57-82) 109/57 (07/03 0934) SpO2:  [98 %-100 %] 98 % (07/03 0934) Arterial Line BP: (122-132)/(59-62) 132/62 (07/02 1900) Weight:  [163 lb (73.9 kg)] 163 lb (73.9 kg) (07/02 1319) Last BM Date : 11/02/23  Intake/Output from previous day: 07/02 0701 - 07/03 0700 In: 5630 [P.O.:150; I.V.:3730; Blood:600; IV Piggyback:1150] Out: 4450 [Urine:4200; Blood:250]  Physical Examination: Gen: NAD, alert and oriented HEENT: normocephalic, atraumatic CV: regular rate and rhythm, no murmurs or rubs Pulm: Clear to auscultation bilaterally, no wheezes or rhonchi Abd: soft, nondistended, mildly hypoactive BS, nontender, incisions are clean and dry. Honeycomb dressing in place. Ext: no edema of extremities, warm and well perfused, SCDs in place  Labs:    Latest Ref Rng & Units 11/03/2023    4:44 AM 11/02/2023    6:08 PM 11/02/2023    5:07 PM  CBC  WBC 4.0 - 10.5 K/uL 11.0     Hemoglobin 12.0 - 15.0 g/dL 89.4  9.2  8.2   Hematocrit 36.0 - 46.0 % 35.0  27.0  24.0   Platelets 150 - 400 K/uL 750         Latest Ref Rng & Units 11/03/2023    4:44 AM 11/02/2023    6:08 PM 11/02/2023    5:07 PM  BMP  Glucose 70 - 99 mg/dL 863     BUN 6 - 20 mg/dL <5     Creatinine 9.55 - 1.00 mg/dL 9.55     Sodium 864 - 854 mmol/L 137  139  140   Potassium 3.5 - 5.1 mmol/L 3.6  3.6  3.6   Chloride 98 - 111 mmol/L 104     CO2 22 - 32 mmol/L 25      Calcium 8.9 - 10.3 mg/dL 8.5      Assessment:  58 y.o. s/p Procedure(s): LAPAROSCOPY, DIAGNOSTIC HYSTERECTOMY, TOTAL, ROBOT-ASSISTED, LAPAROSCOPIC, WITH BILATERAL SALPINGO-OOPHORECTOMY DEBULKING, ABDOMINAL MINI LAPAROTOMY: progressing well  Post-Op: meeting early milestones. Will dc foley this morning. ADAT. Encouraged ambulation. Awaiting return of bowel function.  Chronic anemia: asymptomatic, Hgb higher this morning after surgery and 2u pRBCs she received.   Prophylaxis: SCDs, lovenox.  Plan: See above The patient is to be discharged to home.   LOS: 1 day    [name] 11/03/2023, 10:54 AM

## 2023-11-03 NOTE — Progress Notes (Signed)
 Mobility Specialist - Progress Note   11/03/23 1010  Mobility  Activity Ambulated with assistance in hallway  Level of Assistance Modified independent, requires aide device or extra time  Assistive Device Front wheel walker  Distance Ambulated (ft) 460 ft  Activity Response Tolerated well  Mobility Referral Yes  Mobility visit 1 Mobility  Mobility Specialist Start Time (ACUTE ONLY) 0951  Mobility Specialist Stop Time (ACUTE ONLY) 1010  Mobility Specialist Time Calculation (min) (ACUTE ONLY) 19 min   Pt received in bed and agreeable to mobility. No complaints during session. Pt to recliner after session with all needs met.    [facility]

## 2023-11-03 NOTE — Progress Notes (Signed)
   11/03/23 1047  TOC Brief Assessment  Insurance and Status Reviewed  Patient has primary care physician Yes  Home environment has been reviewed resides in private residence  Prior level of function: Independent  Prior/Current Home Services No current home services  Social Drivers of Health Review SDOH reviewed no interventions necessary  Readmission risk has been reviewed Yes  Transition of care needs no transition of care needs at this time

## 2023-11-04 LAB — BASIC METABOLIC PANEL WITH GFR
Anion gap: 15 (ref 5–15)
BUN: 8 mg/dL (ref 6–20)
CO2: 23 mmol/L (ref 22–32)
Calcium: 8.4 mg/dL — ABNORMAL LOW (ref 8.9–10.3)
Chloride: 101 mmol/L (ref 98–111)
Creatinine, Ser: 0.69 mg/dL (ref 0.44–1.00)
GFR, Estimated: >60 mL/min (ref >60)
Glucose, Bld: 79 mg/dL (ref 70–99)
Potassium: 3.5 mmol/L (ref 3.5–5.1)
Sodium: 139 mmol/L (ref 135–145)

## 2023-11-04 LAB — CBC
HCT: 35.2 % — ABNORMAL LOW (ref 36.0–46.0)
Hemoglobin: 10.3 g/dL — ABNORMAL LOW (ref 12.0–15.0)
MCH: 23 pg — ABNORMAL LOW (ref 26.0–34.0)
MCHC: 29.3 g/dL — ABNORMAL LOW (ref 30.0–36.0)
MCV: 78.7 fL — ABNORMAL LOW (ref 80.0–100.0)
Platelets: 732 K/uL — ABNORMAL HIGH (ref 150–400)
RBC: 4.47 MIL/uL (ref 3.87–5.11)
RDW: 23.9 % — ABNORMAL HIGH (ref 11.5–15.5)
WBC: 9.6 K/uL (ref 4.0–10.5)
nRBC: 0 % (ref 0.0–0.2)

## 2023-11-04 NOTE — Progress Notes (Signed)
 2 Days Post-Op Procedure(s) (LRB): LAPAROSCOPY, DIAGNOSTIC (N/A) HYSTERECTOMY, TOTAL, ROBOT-ASSISTED, LAPAROSCOPIC, WITH BILATERAL SALPINGO-OOPHORECTOMY (N/A) DEBULKING, ABDOMINAL (N/A) MINI LAPAROTOMY (N/A)  Subjective: Patient reports doing well. Minimal pain.  Reports that she ambulated frequently yesterday and was sitting up in chair.  Tolerating regular diet without nausea or vomiting.  No flatus or bowel movement.  Voiding without issue.  Desiring to go home.   Objective: Vital signs in last 24 hours: Temp:  [97.6 F (36.4 C)-98.8 F (37.1 C)] 98.7 F (37.1 C) (07/04 0606) Pulse Rate:  [89-97] 89 (07/04 0606) Resp:  [16-17] 17 (07/04 0606) BP: (106-120)/(57-72) 117/71 (07/04 0606) SpO2:  [96 %-100 %] 97 % (07/04 0606) Last BM Date : 11/02/23  Intake/Output from previous day: 07/03 0701 - 07/04 0700 In: 720 [P.O.:720] Out: 1000 [Urine:1000]  Physical Examination: Gen: NAD, alert and oriented HEENT: normocephalic, atraumatic CV: regular rate and rhythm, no murmurs or rubs Pulm: Clear to auscultation bilaterally, no wheezes or rhonchi Abd: soft, nondistended, normal BS, nontender, incisions are clean and dry. Honeycomb dressing in place. Ext: no edema of extremities, warm and well perfused  Labs:    Latest Ref Rng & Units 11/04/2023    4:45 AM 11/03/2023    4:44 AM 11/02/2023    6:08 PM  CBC  WBC 4.0 - 10.5 K/uL 9.6  11.0    Hemoglobin 12.0 - 15.0 g/dL 89.6  89.4  9.2   Hematocrit 36.0 - 46.0 % 35.2  35.0  27.0   Platelets 150 - 400 K/uL 732  750        Latest Ref Rng & Units 11/04/2023    4:45 AM 11/03/2023    4:44 AM 11/02/2023    6:08 PM  BMP  Glucose 70 - 99 mg/dL 79  863    BUN 6 - 20 mg/dL 8  <5    Creatinine 9.55 - 1.00 mg/dL 9.30  9.55    Sodium 864 - 145 mmol/L 139  137  139   Potassium 3.5 - 5.1 mmol/L 3.5  3.6  3.6   Chloride 98 - 111 mmol/L 101  104    CO2 22 - 32 mmol/L 23  25    Calcium  8.9 - 10.3 mg/dL 8.4  8.5     Assessment:  58 y.o. s/p  Procedure(s): LAPAROSCOPY, DIAGNOSTIC HYSTERECTOMY, TOTAL, ROBOT-ASSISTED, LAPAROSCOPIC, WITH BILATERAL SALPINGO-OOPHORECTOMY DEBULKING, ABDOMINAL MINI LAPAROTOMY: progressing well  Post-Op: meeting early milestones. Encouraged ambulation. Awaiting return of bowel function.  Chronic anemia: asymptomatic, Hgb stable this morning after surgery and 2u pRBCs she received.   Prophylaxis: SCDs, lovenox .  Plan: See above Awaiting return of bowel function.  Once passing flatus will discharge to home. Plan 2-week course of chemoprophylaxis with Eliquis .   LOS: 2 days    Kristin [name] 11/04/2023, 8:10 AM

## 2023-11-04 NOTE — Progress Notes (Signed)
 Mobility Specialist - Progress Note   11/04/23 1018  Mobility  Activity Ambulated independently in hallway  Level of Assistance Independent  Assistive Device None  Distance Ambulated (ft) 750 ft  Activity Response Tolerated well  Mobility Referral Yes  Mobility visit 1 Mobility  Mobility Specialist Start Time (ACUTE ONLY) 1005  Mobility Specialist Stop Time (ACUTE ONLY) 1017  Mobility Specialist Time Calculation (min) (ACUTE ONLY) 12 min   Pt received in bench and agreeable to mobility. No complaints during session. Pt to bench after session with all needs met.    [facility]

## 2023-11-04 NOTE — Plan of Care (Signed)
  Problem: Education: Goal: Knowledge of General Education information will improve Description: Including pain rating scale, medication(s)/side effects and non-pharmacologic comfort measures Outcome: Progressing   Problem: Health Behavior/Discharge Planning: Goal: Ability to manage health-related needs will improve Outcome: Progressing   Problem: Activity: Goal: Risk for activity intolerance will decrease Outcome: Progressing   Problem: Nutrition: Goal: Adequate nutrition will be maintained Outcome: Progressing   Problem: Coping: Goal: Level of anxiety will decrease Outcome: Progressing   Problem: Elimination: Goal: Will not experience complications related to bowel motility Outcome: Progressing   Problem: Pain Managment: Goal: General experience of comfort will improve and/or be controlled Outcome: Progressing   Problem: Skin Integrity: Goal: Risk for impaired skin integrity will decrease Outcome: Progressing

## 2023-11-05 LAB — CBC
HCT: 35 % — ABNORMAL LOW (ref 36.0–46.0)
Hemoglobin: 10.2 g/dL — ABNORMAL LOW (ref 12.0–15.0)
MCH: 22.9 pg — ABNORMAL LOW (ref 26.0–34.0)
MCHC: 29.1 g/dL — ABNORMAL LOW (ref 30.0–36.0)
MCV: 78.7 fL — ABNORMAL LOW (ref 80.0–100.0)
Platelets: 679 K/uL — ABNORMAL HIGH (ref 150–400)
RBC: 4.45 MIL/uL (ref 3.87–5.11)
RDW: 24 % — ABNORMAL HIGH (ref 11.5–15.5)
WBC: 8.1 K/uL (ref 4.0–10.5)
nRBC: 0 % (ref 0.0–0.2)

## 2023-11-05 LAB — BASIC METABOLIC PANEL WITH GFR
Anion gap: 12 (ref 5–15)
BUN: 9 mg/dL (ref 6–20)
CO2: 25 mmol/L (ref 22–32)
Calcium: 9 mg/dL (ref 8.9–10.3)
Chloride: 104 mmol/L (ref 98–111)
Creatinine, Ser: 0.7 mg/dL (ref 0.44–1.00)
GFR, Estimated: >60 mL/min (ref >60)
Glucose, Bld: 98 mg/dL (ref 70–99)
Potassium: 3.7 mmol/L (ref 3.5–5.1)
Sodium: 141 mmol/L (ref 135–145)

## 2023-11-05 MED ORDER — APIXABAN 2.5 MG PO TABS: 2.5000 mg | ORAL_TABLET | Freq: Two times a day (BID) | ORAL | 0 refills | Status: DC

## 2023-11-05 MED ORDER — POLYETHYLENE GLYCOL 3350 17 G PO PACK: 17.0000 g | PACK | Freq: Every day | ORAL | 0 refills | Status: DC

## 2023-11-05 MED ORDER — BISACODYL 10 MG RE SUPP
10.0000 mg | Freq: Once | RECTAL | Status: AC
  Administered 2023-11-05: 10 mg via RECTAL
  Filled 2023-11-05: qty 1

## 2023-11-05 NOTE — Discharge Summary (Addendum)
 Physician Discharge Summary  Patient ID: Kristin [name] MRN: [id number] DOB/AGE: Aug 20, 1965 58 y.o.  Admit date: 11/02/2023 Discharge date: 11/05/2023  Admission Diagnoses: Uterine mass  Discharge Diagnoses:  Principal Problem:   Uterine mass Active Problems:   Malignant neoplasm of uterus [facility])   Uterine cancer [facility])   Discharged Condition:  The patient is in good condition and stable for discharge.   Hospital Course: On 11/02/2023, the patient underwent the following: Procedure(s): LAPAROSCOPY, DIAGNOSTIC HYSTERECTOMY, TOTAL, ROBOT-ASSISTED, LAPAROSCOPIC, WITH BILATERAL SALPINGO-OOPHORECTOMY DEBULKING, ABDOMINAL MINI LAPAROTOMY.  The postoperative course was uneventful.  She was discharged to home on postoperative day 3 tolerating a regular diet, voiding spontaneously, ambulating without issue, pain well controlled and having bowel function. She will discharge to complete a 2 week course of chemoprophylaxis.   Consults: none  Significant Diagnostic Studies: none  Treatments: Surgery as above  Discharge Exam: Blood pressure 122/73, pulse 86, temperature 98.3 F (36.8 C), temperature source Oral, resp. rate 16, height 5' 4 (1.626 m), weight 163 lb (73.9 kg), SpO2 97%. See exam from progress note today  Disposition: Discharge disposition: 01-Home or Self Care       Discharge Instructions     Remove dressing in 24 hours   Complete by: As directed       Allergies as of 11/05/2023   No Known Allergies      Medication List     TAKE these medications    albuterol  (2.5 MG/3ML) 0.083% nebulizer solution Commonly known as: PROVENTIL  Take 2.5 mg by nebulization every 6 (six) hours as needed for wheezing.   albuterol  108 (90 Base) MCG/ACT inhaler Commonly known as: VENTOLIN  HFA Inhale 1-2 puffs into the lungs every 6 (six) hours as needed for wheezing or shortness of breath.   apixaban  2.5 MG Tabs tablet Commonly known as: Eliquis  Take 1 tablet (2.5 mg total)  by mouth 2 (two) times daily for 11 days.   atorvastatin  20 MG tablet Commonly known as: LIPITOR Take 20 mg by mouth at bedtime.   ferrous sulfate  325 (65 FE) MG tablet Take 1 tablet (325 mg total) by mouth daily.   multivitamin with minerals Tabs tablet Take 1 tablet by mouth daily.   polyethylene glycol 17 g packet Commonly known as: MIRALAX  / GLYCOLAX  Take 17 g by mouth daily. Start taking on: November 06, 2023   senna-docusate 8.6-50 MG tablet Commonly known as: Senokot-S Take 2 tablets by mouth at bedtime. For AFTER surgery, do not take if having diarrhea   SYSTANE OP Place 1 drop into both eyes 3 (three) times daily as needed (dry/irritated eyes.).   traMADol  50 MG tablet Commonly known as: ULTRAM  Take 1 tablet (50 mg total) by mouth every 6 (six) hours as needed for severe pain (pain score 7-10). For AFTER surgery only, do not take and drive   Xiidra  5 % Soln Generic drug: Lifitegrast  Place 1 drop into both eyes in the morning.        Follow-up Information     Viktoria Comer SAUNDERS, MD Follow up on 11/09/2023.   Specialty: Gynecologic Oncology Why: You will have a PHONE call visit with Dr. Viktoria around one week after surgery. Your in person visit will be on 12/15/2023 at 4pm at the [facility]. Contact information: [address] [place] [address] [phone]                 Greater than thirty minutes were spend for face to face discharge instructions and discharge orders/summary  in EPIC.   SignedBETHA ELDONNA MAYS 11/05/2023, 3:22 PM

## 2023-11-05 NOTE — Progress Notes (Signed)
 3 Days Post-Op Procedure(s) (LRB): LAPAROSCOPY, DIAGNOSTIC (N/A) HYSTERECTOMY, TOTAL, ROBOT-ASSISTED, LAPAROSCOPIC, WITH BILATERAL SALPINGO-OOPHORECTOMY (N/A) DEBULKING, ABDOMINAL (N/A) MINI LAPAROTOMY (N/A)  Subjective: Patient reports doing well. Minimal pain.  Reports that she ambulated frequently yesterday and was sitting up in chair.  Tolerating regular diet without nausea or vomiting.  No flatus or bowel movement still.  Voiding without issue.  Desiring to go home.   Objective: Vital signs in last 24 hours: Temp:  [98.1 F (36.7 C)-99.4 F (37.4 C)] 98.1 F (36.7 C) (07/05 0603) Pulse Rate:  [78-101] 78 (07/05 0603) Resp:  [16] 16 (07/05 0603) BP: (111-113)/(61-70) 113/61 (07/05 0603) SpO2:  [97 %-100 %] 97 % (07/05 0603) Last BM Date : 11/02/23  Intake/Output from previous day: 07/04 0701 - 07/05 0700 In: 1560 [P.O.:1560] Out: -   Physical Examination: Gen: NAD, alert and oriented HEENT: normocephalic, atraumatic CV: regular rate and rhythm, no murmurs or rubs Pulm: Clear to auscultation bilaterally, no wheezes or rhonchi Abd: soft, nondistended, normal BS, nontender, incisions are clean and dry. Honeycomb dressing in place. Ext: no edema of extremities, warm and well perfused  Labs:    Latest Ref Rng & Units 11/05/2023    4:33 AM 11/04/2023    4:45 AM 11/03/2023    4:44 AM  CBC  WBC 4.0 - 10.5 K/uL 8.1  9.6  11.0   Hemoglobin 12.0 - 15.0 g/dL 89.7  89.6  89.4   Hematocrit 36.0 - 46.0 % 35.0  35.2  35.0   Platelets 150 - 400 K/uL 679  732  750       Latest Ref Rng & Units 11/05/2023    4:33 AM 11/04/2023    4:45 AM 11/03/2023    4:44 AM  BMP  Glucose 70 - 99 mg/dL 98  79  863   BUN 6 - 20 mg/dL 9  8  <5   Creatinine 9.55 - 1.00 mg/dL 9.29  9.30  9.55   Sodium 135 - 145 mmol/L 141  139  137   Potassium 3.5 - 5.1 mmol/L 3.7  3.5  3.6   Chloride 98 - 111 mmol/L 104  101  104   CO2 22 - 32 mmol/L 25  23  25    Calcium  8.9 - 10.3 mg/dL 9.0  8.4  8.5     Assessment:  58 y.o. s/p Procedure(s): LAPAROSCOPY, DIAGNOSTIC HYSTERECTOMY, TOTAL, ROBOT-ASSISTED, LAPAROSCOPIC, WITH BILATERAL SALPINGO-OOPHORECTOMY DEBULKING, ABDOMINAL MINI LAPAROTOMY: progressing well  Post-Op: meeting early milestones. Encouraged ambulation. Awaiting return of bowel function.  Chronic anemia: asymptomatic, Hgb stable this morning after surgery and 2u pRBCs she received.   Prophylaxis: SCDs, lovenox .  Plan: See above Awaiting return of bowel function.  Once passing flatus will discharge to home. Will give suppository today. Plan 2-week course of chemoprophylaxis with Eliquis .   LOS: 3 days    Lauria Depoy 11/05/2023, 7:26 AM

## 2023-11-05 NOTE — Plan of Care (Signed)
  Problem: Education: Goal: Knowledge of General Education information will improve Description: Including pain rating scale, medication(s)/side effects and non-pharmacologic comfort measures Outcome: Progressing   Problem: Clinical Measurements: Goal: Respiratory complications will improve Outcome: Progressing Goal: Cardiovascular complication will be avoided Outcome: Progressing   Problem: Activity: Goal: Risk for activity intolerance will decrease Outcome: Progressing   Problem: Nutrition: Goal: Adequate nutrition will be maintained Outcome: Progressing   Problem: Coping: Goal: Level of anxiety will decrease Outcome: Progressing   Problem: Elimination: Goal: Will not experience complications related to urinary retention Outcome: Progressing   Problem: Pain Managment: Goal: General experience of comfort will improve and/or be controlled Outcome: Progressing

## 2023-11-07 ENCOUNTER — Telehealth: Payer: Self-pay | Admitting: *Deleted

## 2023-11-07 ENCOUNTER — Telehealth: Payer: Self-pay

## 2023-11-07 NOTE — Telephone Encounter (Signed)
 Spoke with Ms.Zheng this afternoon. She states she is eating, drinking and urinating well. She has had a BM on Saturday, but none since then. She is passing gas. She is taking miralax  once daily and advised to pick up senokot-s at pharmacy, this is over the counter and take 2 in the evening with 8 oz. of water  as prescribed and encouraged her to drink plenty of water . She denies fever or chills. Incisions are dry and intact. She rates her pain 5/10. Pt states her pain comes (5/10) and goes and she is not taking anything for it.   Instructed to call office with any fever, chills, purulent drainage, uncontrolled pain or any other questions or concerns. Patient verbalizes understanding.   Pt aware of post op appointments as well as the office number [phone] and after hours number [phone] to call if she has any questions or concerns.

## 2023-11-07 NOTE — Telephone Encounter (Signed)
 FMLA forms received from [employer] administration. Requested information provided and faxed.

## 2023-11-09 ENCOUNTER — Encounter: Payer: Self-pay | Admitting: Gynecologic Oncology

## 2023-11-09 ENCOUNTER — Inpatient Hospital Stay: Attending: Gynecologic Oncology | Admitting: Gynecologic Oncology

## 2023-11-09 DIAGNOSIS — K5909 Other constipation: Secondary | ICD-10-CM | POA: Insufficient documentation

## 2023-11-09 DIAGNOSIS — D509 Iron deficiency anemia, unspecified: Secondary | ICD-10-CM | POA: Insufficient documentation

## 2023-11-09 DIAGNOSIS — C55 Malignant neoplasm of uterus, part unspecified: Secondary | ICD-10-CM | POA: Insufficient documentation

## 2023-11-09 DIAGNOSIS — Z90710 Acquired absence of both cervix and uterus: Secondary | ICD-10-CM | POA: Insufficient documentation

## 2023-11-09 DIAGNOSIS — C549 Malignant neoplasm of corpus uteri, unspecified: Secondary | ICD-10-CM

## 2023-11-09 DIAGNOSIS — Z90722 Acquired absence of ovaries, bilateral: Secondary | ICD-10-CM | POA: Insufficient documentation

## 2023-11-09 DIAGNOSIS — Z9079 Acquired absence of other genital organ(s): Secondary | ICD-10-CM | POA: Insufficient documentation

## 2023-11-09 NOTE — Progress Notes (Signed)
 Gynecologic Oncology Telehealth Note: Gyn-Onc  I connected with Kristin [name] on 11/09/23 at  6:00 PM EDT by telephone and verified that I am speaking with the correct person using two identifiers.  I discussed the limitations, risks, security and privacy concerns of performing an evaluation and management service by telemedicine and the availability of in-person appointments. I also discussed with the patient that there may be a patient responsible charge related to this service. The patient expressed understanding and agreed to proceed.  Other persons participating in the visit and their role in the encounter: none.  Patient's location: home, [place] Provider's location: [facility], [place]  Reason for Visit: follow-up  Treatment History: 11/02/23: Robotic-assisted laparoscopic total hysterectomy with bilateral salpingo-oophorectomy (uterus > 250 gms), laparotomy for specimen removal, resection of omentum adherent to the cecum   Interval History: Minimal spotting when she wipes after voiding, started yesterday.  Bowels moving, some diarrhea now. Using Miralax  and Sennakot. Voiding without issues. Pain controlled. Using heat and ice.  Moving around well.  Past Medical/Surgical History: Past Medical History:  Diagnosis Date   Anemia    Arthritis    knee, back; improved with weight loss   Asthma    Dry eyes    GERD (gastroesophageal reflux disease)    Hyperlipidemia    Uterine fibroid     Past Surgical History:  Procedure Laterality Date   COLONOSCOPY     DEBULKING N/A 11/02/2023   Procedure: DEBULKING, ABDOMINAL;  Surgeon: Viktoria Comer SAUNDERS, MD;  Location: [facility];  Service: Gynecology;  Laterality: N/A;  POSSIBLE DEBULKING   DILATATION & CURETTAGE/HYSTEROSCOPY WITH MYOSURE N/A 04/29/2016   Procedure: DILATATION & CURETTAGE/HYSTEROSCOPY WITH MYOSURE;  Surgeon: Dickie Carder, MD;  Location: [facility];  Service: Gynecology;  Laterality: N/A;   LAPAROSCOPY N/A 11/02/2023   Procedure:  LAPAROSCOPY, DIAGNOSTIC;  Surgeon: Viktoria Comer SAUNDERS, MD;  Location: [facility];  Service: Gynecology;  Laterality: N/A;   LAPAROTOMY N/A 11/02/2023   Procedure: MINI LAPAROTOMY;  Surgeon: Viktoria Comer SAUNDERS, MD;  Location: [facility];  Service: Gynecology;  Laterality: N/A;   ROBOTIC ASSISTED TOTAL HYSTERECTOMY WITH BILATERAL SALPINGO OOPHERECTOMY N/A 11/02/2023   Procedure: HYSTERECTOMY, TOTAL, ROBOT-ASSISTED, LAPAROSCOPIC, WITH BILATERAL SALPINGO-OOPHORECTOMY;  Surgeon: Viktoria Comer SAUNDERS, MD;  Location: [facility];  Service: Gynecology;  Laterality: N/A;   TUBAL LIGATION     UPPER GI ENDOSCOPY      Family History  Problem Relation Age of Onset   Breast cancer Neg Hx    Colon cancer Neg Hx    Pancreatic cancer Neg Hx    Prostate cancer Neg Hx    Ovarian cancer Neg Hx    Endometrial cancer Neg Hx     Social History   Socioeconomic History   Marital status: Married    Spouse name: Not on file   Number of children: Not on file   Years of education: Not on file   Highest education level: Not on file  Occupational History   Not on file  Tobacco Use   Smoking status: Never    Passive exposure: Never   Smokeless tobacco: Never  Vaping Use   Vaping status: Never Used  Substance and Sexual Activity   Alcohol  use: No   Drug use: No   Sexual activity: Not on file  Other Topics Concern   Not on file  Social History Narrative   Not on file   Social Drivers of Health   Financial Resource Strain: Low Risk  (10/17/2023)   Received from  [facility]   Overall Financial Resource Strain (CARDIA)    Difficulty of Paying Living Expenses: Not hard at all  Food Insecurity: No Food Insecurity (11/02/2023)   Hunger Vital Sign    Worried About Running Out of Food in the Last Year: Never true    Ran Out of Food in the Last Year: Never true  Transportation Needs: No Transportation Needs (11/02/2023)   PRAPARE - Administrator, Civil Service (Medical): No    Lack of Transportation  (Non-Medical): No  Physical Activity: Not on file  Stress: Not on file  Social Connections: Not on file    Current Medications:  Current Outpatient Medications:    albuterol  (PROVENTIL ) (2.5 MG/3ML) 0.083% nebulizer solution, Take 2.5 mg by nebulization every 6 (six) hours as needed for wheezing., Disp: , Rfl:    albuterol  (VENTOLIN  HFA) 108 (90 Base) MCG/ACT inhaler, Inhale 1-2 puffs into the lungs every 6 (six) hours as needed for wheezing or shortness of breath., Disp: 18 g, Rfl: 0   apixaban  (ELIQUIS ) 2.5 MG TABS tablet, Take 1 tablet (2.5 mg total) by mouth 2 (two) times daily for 11 days., Disp: 22 tablet, Rfl: 0   atorvastatin  (LIPITOR) 20 MG tablet, Take 20 mg by mouth at bedtime., Disp: , Rfl:    ferrous sulfate  325 (65 FE) MG tablet, Take 1 tablet (325 mg total) by mouth daily., Disp: 30 tablet, Rfl: 0   Lifitegrast  (XIIDRA ) 5 % SOLN, Place 1 drop into both eyes in the morning., Disp: , Rfl:    Multiple Vitamin (MULTIVITAMIN WITH MINERALS) TABS tablet, Take 1 tablet by mouth daily., Disp: , Rfl:    Polyethyl Glycol-Propyl Glycol (SYSTANE OP), Place 1 drop into both eyes 3 (three) times daily as needed (dry/irritated eyes.)., Disp: , Rfl:    polyethylene glycol (MIRALAX  / GLYCOLAX ) 17 g packet, Take 17 g by mouth daily., Disp: 14 each, Rfl: 0   senna-docusate (SENOKOT-S) 8.6-50 MG tablet, Take 2 tablets by mouth at bedtime. For AFTER surgery, do not take if having diarrhea, Disp: 30 tablet, Rfl: 0   traMADol  (ULTRAM ) 50 MG tablet, Take 1 tablet (50 mg total) by mouth every 6 (six) hours as needed for severe pain (pain score 7-10). For AFTER surgery only, do not take and drive, Disp: 10 tablet, Rfl: 0  Review of Symptoms: Pertinent positives as per HPI.  Physical Exam: Deferred given limitations of phone visit.  Laboratory & Radiologic Studies: None new  Assessment & Plan: [name] is a 58 y.o. 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  Patient is doing very well from a postoperative standpoint.  Discussed continued expectations and restrictions.  Given diarrhea, advised her to stop MiraLAX  and Senokot for now until her stool becomes more formed.  Pathology is not yet back from surgery.  I will call her with these results to discuss next steps in her treatment plan.  I discussed the assessment and treatment plan with the patient. The patient was provided with an opportunity to ask questions and all were answered. The patient agreed with the plan and demonstrated an understanding of the instructions.   The patient was advised to call back or see an in-person evaluation if the symptoms worsen or if the condition fails to improve as anticipated.   8 minutes of total time was spent for this patient encounter, including preparation, phone counseling with the patient and coordination of care, and documentation of the  encounter.   Comer Dollar, MD  Division of Gynecologic Oncology  Department of Obstetrics and Gynecology  [facility]  [facility]

## 2023-11-11 ENCOUNTER — Inpatient Hospital Stay: Admission: RE | Admit: 2023-11-11 | Source: Ambulatory Visit

## 2023-11-12 NOTE — Anesthesia Postprocedure Evaluation (Signed)
 Anesthesia Post Note  Patient: Kristin [name]  Procedure(s) Performed: LAPAROSCOPY, DIAGNOSTIC (Abdomen) HYSTERECTOMY, TOTAL, ROBOT-ASSISTED, LAPAROSCOPIC, WITH BILATERAL SALPINGO-OOPHORECTOMY (Abdomen) DEBULKING, ABDOMINAL MINI LAPAROTOMY (Abdomen)     Patient location during evaluation: PACU Anesthesia Type: General Level of consciousness: awake and alert Pain management: pain level controlled Vital Signs Assessment: post-procedure vital signs reviewed and stable Respiratory status: spontaneous breathing, nonlabored ventilation, respiratory function stable and patient connected to nasal cannula oxygen Cardiovascular status: blood pressure returned to baseline and stable Postop Assessment: no apparent nausea or vomiting Anesthetic complications: no   There were no known notable events for this encounter.  Last Vitals:  Vitals:   11/05/23 0603 11/05/23 1410  BP: 113/61 122/73  Pulse: 78 86  Resp: 16   Temp: 36.7 C 36.8 C  SpO2: 97% 97%    Last Pain:  Vitals:   11/05/23 1410  TempSrc: Oral  PainSc:    Pain Goal:                   Olyn Landstrom L Kamyah Wilhelmsen

## 2023-11-15 ENCOUNTER — Telehealth: Payer: Self-pay | Admitting: *Deleted

## 2023-11-15 NOTE — Telephone Encounter (Signed)
 Spoke with Kristin [name] who called the office asking when she should finish taking her eliquis ? Advised patient per Kristin Epps, NP to finish taking her tablets twice daily until she has none left. Pt states she has 7 tablets left and that would be Saturday, July 19th. Would be last day to finish which is two weeks since her surgery. Pt also asking about her pathology report. Advised patient it is still pending and when it's complete Dr.Tucker will be calling. Pt verbalized understanding and thanked the office.

## 2023-11-21 ENCOUNTER — Telehealth: Payer: Self-pay | Admitting: Surgery

## 2023-11-21 NOTE — Telephone Encounter (Signed)
 Patient called stating she noticed an abrasion on her vagina. Clarified with patient that it is outside and not actually inside her vagina. Patient denies any itching or bleeding. No fevers or other discharge. Denies going swimming or being submerged in water  and nothing in the vagina. States it is painful when she walks and that in the past when this has happened her ob/gyn examined her and ultimately prescribed augmentin and it went away. Advised patient that Dr Viktoria and Eleanor would be notified and someone from our office would call her back to schedule appointment.

## 2023-11-22 ENCOUNTER — Telehealth: Payer: Self-pay | Admitting: Oncology

## 2023-11-22 NOTE — Telephone Encounter (Signed)
 Called Kristin [name] and advised her of appointment with Dr. Lonn on 11/25/2023 at 2:00 with arrival to the cancer center to check in at 1:30.  She verbalized understanding and agreement.

## 2023-11-22 NOTE — Telephone Encounter (Signed)
 Spoke with patient who states she can't tell if the abrasion on her vulva is worse or improving, pt states, I don't know, it's about the same and only bothers me when I walk Like the skin is rubbing?  Pt states she has an appointment with Dr. Lonn on Friday and while she is there would someone be able to look at it? Advised patient that Eleanor Epps, NP will look at the area and determine if a Rx would be necessary. Pt agreed and thanked the office for calling.

## 2023-11-23 ENCOUNTER — Telehealth: Payer: Self-pay | Admitting: Oncology

## 2023-11-23 NOTE — Telephone Encounter (Signed)
 Called Kristin [name] and advised her of the telephone visit with Dr. Viktoria tomorrow to discuss her preliminary pathology results.  Kristin [name] verbalized understanding and agreement.

## 2023-11-24 ENCOUNTER — Encounter: Payer: Self-pay | Admitting: Gynecologic Oncology

## 2023-11-24 ENCOUNTER — Inpatient Hospital Stay (HOSPITAL_BASED_OUTPATIENT_CLINIC_OR_DEPARTMENT_OTHER): Admitting: Gynecologic Oncology

## 2023-11-24 DIAGNOSIS — Z90710 Acquired absence of both cervix and uterus: Secondary | ICD-10-CM

## 2023-11-24 DIAGNOSIS — Z9079 Acquired absence of other genital organ(s): Secondary | ICD-10-CM

## 2023-11-24 DIAGNOSIS — Z7189 Other specified counseling: Secondary | ICD-10-CM

## 2023-11-24 DIAGNOSIS — Z90722 Acquired absence of ovaries, bilateral: Secondary | ICD-10-CM

## 2023-11-24 DIAGNOSIS — C549 Malignant neoplasm of corpus uteri, unspecified: Secondary | ICD-10-CM

## 2023-11-24 NOTE — Progress Notes (Signed)
 Gynecologic Oncology Telehealth Note: Gyn-Onc  I connected with Kristin [name] on 11/24/23 at  6:00 PM EDT by telephone and verified that I am speaking with the correct person using two identifiers.  I discussed the limitations, risks, security and privacy concerns of performing an evaluation and management service by telemedicine and the availability of in-person appointments. I also discussed with the patient that there may be a patient responsible charge related to this service. The patient expressed understanding and agreed to proceed.  Other persons participating in the visit and their role in the encounter: patient's son, Kristin [name].  Patient's location: home, [place] Provider's location: [facility], [place]  Reason for Visit: follow-up  Treatment History: Patient initially experienced lower abdominal fullness and discomfort.  She saw gastroenterology and for right due to symptoms and weight loss.  Imaging of her abdomen was ordered.  07/14/23: CT of the abdomen and pelvis shows a large pelvic mass measuring 14.7 x 14.7 x 12 cm, suspected to be a degenerated/liquid fundal fibroid.  On prior CT imaging it measured up to 8.6 cm.  Difficult to identify endometrium or ovaries.  Prominent parametrial vasculature.  No ascites or adenopathy.  Mild hepatic cysts show interval enlargement since prior CT scan.  She was seen in the emergency department for weight loss in mid May as well as recent fever up to 102.  Was noted to be tachycardic.  Workup was overall reassuring with no leukocytosis, normal lactate as well as TSH. Chest x-ray at that visit showed a 4 mm nodular opacity overlying the lateral aspect of the right upper lobe, CT recommended.  10/24/23: CT chest -  1. No acute findings.  No significant pulmonary lesion. 2. Aberrant right subclavian artery, anatomic variant.  11/02/23: Robotic-assisted laparoscopic total hysterectomy with bilateral salpingo-oophorectomy (uterus > 250 gms), laparotomy for specimen  removal, resection of omentum adherent to the cecum   Interval History: Doing well.  Still has some soreness.  Reports bowels are moving well.  Denies any bleeding.  Past Medical/Surgical History: Past Medical History:  Diagnosis Date   Anemia    Arthritis    knee, back; improved with weight loss   Asthma    Dry eyes    GERD (gastroesophageal reflux disease)    Hyperlipidemia    Uterine fibroid     Past Surgical History:  Procedure Laterality Date   COLONOSCOPY     DEBULKING N/A 11/02/2023   Procedure: DEBULKING, ABDOMINAL;  Surgeon: Viktoria Comer SAUNDERS, MD;  Location: [facility];  Service: Gynecology;  Laterality: N/A;  POSSIBLE DEBULKING   DILATATION & CURETTAGE/HYSTEROSCOPY WITH MYOSURE N/A 04/29/2016   Procedure: DILATATION & CURETTAGE/HYSTEROSCOPY WITH MYOSURE;  Surgeon: Dickie Carder, MD;  Location: [facility];  Service: Gynecology;  Laterality: N/A;   LAPAROSCOPY N/A 11/02/2023   Procedure: LAPAROSCOPY, DIAGNOSTIC;  Surgeon: Viktoria Comer SAUNDERS, MD;  Location: [facility];  Service: Gynecology;  Laterality: N/A;   LAPAROTOMY N/A 11/02/2023   Procedure: MINI LAPAROTOMY;  Surgeon: Viktoria Comer SAUNDERS, MD;  Location: [facility];  Service: Gynecology;  Laterality: N/A;   ROBOTIC ASSISTED TOTAL HYSTERECTOMY WITH BILATERAL SALPINGO OOPHERECTOMY N/A 11/02/2023   Procedure: HYSTERECTOMY, TOTAL, ROBOT-ASSISTED, LAPAROSCOPIC, WITH BILATERAL SALPINGO-OOPHORECTOMY;  Surgeon: Viktoria Comer SAUNDERS, MD;  Location: [facility];  Service: Gynecology;  Laterality: N/A;   TUBAL LIGATION     UPPER GI ENDOSCOPY      Family History  Problem Relation Age of Onset   Breast cancer Neg Hx    Colon cancer Neg Hx  Pancreatic cancer Neg Hx    Prostate cancer Neg Hx    Ovarian cancer Neg Hx    Endometrial cancer Neg Hx     Social History   Socioeconomic History   Marital status: Married    Spouse name: Not on file   Number of children: Not on file   Years of education: Not on file   Highest education level: Not  on file  Occupational History   Not on file  Tobacco Use   Smoking status: Never    Passive exposure: Never   Smokeless tobacco: Never  Vaping Use   Vaping status: Never Used  Substance and Sexual Activity   Alcohol  use: No   Drug use: No   Sexual activity: Not on file  Other Topics Concern   Not on file  Social History Narrative   Not on file   Social Drivers of Health   Financial Resource Strain: Low Risk  (10/17/2023)   Received from [facility]   Overall Financial Resource Strain (CARDIA)    Difficulty of Paying Living Expenses: Not hard at all  Food Insecurity: No Food Insecurity (11/02/2023)   Hunger Vital Sign    Worried About Running Out of Food in the Last Year: Never true    Ran Out of Food in the Last Year: Never true  Transportation Needs: No Transportation Needs (11/02/2023)   PRAPARE - Administrator, Civil Service (Medical): No    Lack of Transportation (Non-Medical): No  Physical Activity: Not on file  Stress: Not on file  Social Connections: Not on file    Current Medications:  Current Outpatient Medications:    albuterol  (PROVENTIL ) (2.5 MG/3ML) 0.083% nebulizer solution, Take 2.5 mg by nebulization every 6 (six) hours as needed for wheezing., Disp: , Rfl:    albuterol  (VENTOLIN  HFA) 108 (90 Base) MCG/ACT inhaler, Inhale 1-2 puffs into the lungs every 6 (six) hours as needed for wheezing or shortness of breath., Disp: 18 g, Rfl: 0   apixaban  (ELIQUIS ) 2.5 MG TABS tablet, Take 1 tablet (2.5 mg total) by mouth 2 (two) times daily for 11 days., Disp: 22 tablet, Rfl: 0   atorvastatin  (LIPITOR) 20 MG tablet, Take 20 mg by mouth at bedtime., Disp: , Rfl:    ferrous sulfate  325 (65 FE) MG tablet, Take 1 tablet (325 mg total) by mouth daily., Disp: 30 tablet, Rfl: 0   Lifitegrast  (XIIDRA ) 5 % SOLN, Place 1 drop into both eyes in the morning., Disp: , Rfl:    Multiple Vitamin (MULTIVITAMIN WITH MINERALS) TABS tablet, Take 1 tablet by mouth daily., Disp:  , Rfl:    Polyethyl Glycol-Propyl Glycol (SYSTANE OP), Place 1 drop into both eyes 3 (three) times daily as needed (dry/irritated eyes.)., Disp: , Rfl:    polyethylene glycol (MIRALAX  / GLYCOLAX ) 17 g packet, Take 17 g by mouth daily., Disp: 14 each, Rfl: 0   senna-docusate (SENOKOT-S) 8.6-50 MG tablet, Take 2 tablets by mouth at bedtime. For AFTER surgery, do not take if having diarrhea, Disp: 30 tablet, Rfl: 0   traMADol  (ULTRAM ) 50 MG tablet, Take 1 tablet (50 mg total) by mouth every 6 (six) hours as needed for severe pain (pain score 7-10). For AFTER surgery only, do not take and drive, Disp: 10 tablet, Rfl: 0  Review of Symptoms: Pertinent positives as per HPI.  Physical Exam: Deferred given limitations of phone visit.  Laboratory & Radiologic Studies: A. UTERUS, CERVIX, FALLOPIAN TUBE, OVARY, BILATERAL, HYSTERECTOMY:  -  High-grade sarcoma involving the uterus, see comment   B. CECAL MESENTERY IMPLANT, BIOPSY:  -  High-grade sarcoma, see comment   C. OMENTAL NODULE, BIOPSY:  - Portion of omentum with mild fibrosis, negative for malignancy   D. OMENTUM:  - High-grade sarcoma, see comment    COMMENT:   Multiple immunohistochemical stains are performed for characterization  of the malignant lesions but are noncontributory.  The case will be sent  out to [facility] [place] for a consult and an addendum will follow.   Assessment & Plan: Kristin [name] is a 58 y.o. woman with Stage IIIB high-grade sarcoma who presents for phone follow-up.  Patient is overall doing well.  Reviewed restrictions and expectations.  Discussed pathology with her from surgery.  Initial review here is consistent with a high-grade sarcoma.  Pathology has been sent for outside second opinion.  Discussed that in the setting of metastatic high-grade sarcoma, typically recommendation is for systemic treatment with chemotherapy.  Patient is scheduled to see Dr. Lonn tomorrow.  Questions answered.  I  discussed the assessment and treatment plan with the patient. The patient was provided with an opportunity to ask questions and all were answered. The patient agreed with the plan and demonstrated an understanding of the instructions.   The patient was advised to call back or see an in-person evaluation if the symptoms worsen or if the condition fails to improve as anticipated.   18 minutes of total time was spent for this patient encounter, including preparation, phone counseling with the patient and coordination of care, and documentation of the encounter.   Comer Dollar, MD  Division of Gynecologic Oncology  Department of Obstetrics and Gynecology  [facility] [place]  [facility]

## 2023-11-25 ENCOUNTER — Encounter: Payer: Self-pay | Admitting: Hematology and Oncology

## 2023-11-25 ENCOUNTER — Inpatient Hospital Stay (HOSPITAL_BASED_OUTPATIENT_CLINIC_OR_DEPARTMENT_OTHER): Admitting: Hematology and Oncology

## 2023-11-25 VITALS — BP 162/82 | HR 111 | Temp 99.4°F | Resp 18 | Ht 64.0 in | Wt 159.0 lb

## 2023-11-25 DIAGNOSIS — Z9079 Acquired absence of other genital organ(s): Secondary | ICD-10-CM | POA: Diagnosis not present

## 2023-11-25 DIAGNOSIS — K5909 Other constipation: Secondary | ICD-10-CM | POA: Diagnosis not present

## 2023-11-25 DIAGNOSIS — D509 Iron deficiency anemia, unspecified: Secondary | ICD-10-CM

## 2023-11-25 DIAGNOSIS — C55 Malignant neoplasm of uterus, part unspecified: Secondary | ICD-10-CM | POA: Diagnosis not present

## 2023-11-25 DIAGNOSIS — Z90710 Acquired absence of both cervix and uterus: Secondary | ICD-10-CM | POA: Diagnosis not present

## 2023-11-25 DIAGNOSIS — Z90722 Acquired absence of ovaries, bilateral: Secondary | ICD-10-CM | POA: Diagnosis not present

## 2023-11-25 NOTE — Assessment & Plan Note (Addendum)
 Discussed importance of regular laxative therapy

## 2023-11-25 NOTE — Assessment & Plan Note (Addendum)
 I reviewed surgical report with the patient and her husband Final pathology report is not released as tissue is sent to tertiary center for second opinion Given high-grade sarcoma involvement of intraperitoneal area, we discussed the role of repeat staging imaging studies, role for chemotherapy and port placement She is in agreement to proceed I will order CT imaging of the chest, abdomen and pelvis with contrast and port to be placed I will see her back in 2 weeks to review test results and discuss next plan of care

## 2023-11-25 NOTE — Progress Notes (Signed)
 [facility] OFFICE PROGRESS NOTE  Patient Care Team: Emilio Joesph [name] as PCP - General (Physician Assistant)  Assessment & Plan Malignant neoplasm of uterus, unspecified site [facility]) I reviewed surgical report with the patient and her husband Final pathology report is not released as tissue is sent to tertiary center for second opinion Given high-grade sarcoma involvement of intraperitoneal area, we discussed the role of repeat staging imaging studies, role for chemotherapy and port placement She is in agreement to proceed I will order CT imaging of the chest, abdomen and pelvis with contrast and port to be placed I will see her back in 2 weeks to review test results and discuss next plan of care Iron deficiency anemia, unspecified iron deficiency anemia type She will continue taking oral iron supplement Other constipation Discussed importance of regular laxative therapy  Orders Placed This Encounter  Procedures   CT CHEST ABDOMEN PELVIS W CONTRAST    Standing Status:   Future    Expected Date:   12/02/2023    Expiration Date:   11/24/2024    If indicated for the ordered procedure, I authorize the administration of contrast media per Radiology protocol:   Yes    Does the patient have a contrast media/X-ray dye allergy?:   No    Is patient pregnant?:   No    Preferred imaging location?:   [facility]    If indicated for the ordered procedure, I authorize the administration of oral contrast media per Radiology protocol:   Yes   IR IMAGING GUIDED PORT INSERTION    Standing Status:   Future    Expected Date:   12/02/2023    Expiration Date:   11/24/2024    Reason for Exam (SYMPTOM  OR DIAGNOSIS REQUIRED):   need port for chemo for 8.8    Preferred Imaging Location?:   [facility]     Almarie Bedford, MD  INTERVAL HISTORY: she returns for surveillance follow-up after recent surgery I saw her in June for evaluation and treatment of iron deficiency  anemia She underwent surgery on July 2 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 Large pelvic mass measuring 14.7 x 14.7 x 12.0 cm. This is likely a degenerated/liquid fundal fibroid. On the prior CT scan (2018) it measured 8.6 x 8.0 x 7.8 cm. 2. Multiple hepatic cysts showing interval enlargement since the prior CT scan. 3. No acute abdominal/pelvic findings or adenopathy.   10/24/2023 Imaging   1. No acute findings.  No significant pulmonary lesion. 2. Aberrant right subclavian artery, anatomic variant.   11/02/2023 Initial Diagnosis   Malignant neoplasm of uterus (HCC)   11/02/2023 Pathology Results   SURGICAL PATHOLOGY  CASE: [id number]  PATIENT: Kristin [name]  Surgical Pathology Report   Clinical History: Uterine mass (las)   FINAL MICROSCOPIC DIAGNOSIS:  A. UTERUS, CERVIX, FALLOPIAN TUBE, OVARY, BILATERAL, HYSTERECTOMY:  - High-grade sarcoma involving  the uterus, see comment   B. CECAL MESENTERY IMPLANT, BIOPSY:  -  High-grade sarcoma, see comment   C. OMENTAL NODULE, BIOPSY:  - Portion of omentum with mild fibrosis, negative for malignancy   D. OMENTUM:  - High-grade sarcoma, see comment   Multiple immunohistochemical stains are performed for characterization of the malignant lesions but are noncontributory.  The case will be sent out to [facility], [place] for a consult and an addendum will follow.      11/02/2023 Surgery   Pre-operative Diagnosis: Uterine mass   Post-operative Diagnosis: same, at least stage III uterine cancer   Operation: Robotic-assisted laparoscopic total hysterectomy with bilateral salpingo-oophorectomy (uterus > 250 gms), laparotomy for specimen removal, resection of omentum adherent to the cecum   Surgeon: Viktoria Crank MD     Operative Findings:  On EUA, enlarged mobile uterus.  On intra-abdominal examination, normal upper abdominal survey.  Large, approximately 15-16 cm mass arising from the uterine fundus.  Lower uterine segment itself is normal in appearance.  Significant enlargement of ovarian vasculature as well as enlarged uterine arteries.  Normal-appearing bilateral tubes and ovaries.  No ascites.  No obvious pelvic adenopathy.  Only after a portion of the hysterectomy had been completed was not apparent that there was some tumor perforating through a small, less than 1 cm area at the superior right aspect of the uterine mass with adjacent tumor involving omentum that was adherent to the cecum.  Otherwise, omentum with several small areas of thickened tissue, no visible abnormalities.  Small bowel run from the cecum to the ligament of Treitz with no significant findings.  No gross disease at the end of surgery. 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

## 2023-11-25 NOTE — Assessment & Plan Note (Addendum)
 She will continue taking oral iron supplement

## 2023-11-29 ENCOUNTER — Telehealth: Payer: Self-pay

## 2023-11-29 NOTE — Telephone Encounter (Signed)
 Pt called to let us  know her CT has been scheduled for Thursday 7/31 and PAC placement 12/02/23. She is concerned that these dates are appropriate and thought Dr Lonn wanted her to have them both done 11/28/23. Advised pt per expected date on orders that scheduled dates are appropriate. She verbalized thanks and understanding.

## 2023-11-30 NOTE — H&P (Signed)
 Chief Complaint: Malignant neoplasm of uterus; chemotherapy initiation-image guided portacatheter placement  Referring Provider(s): Lonn Hicks   Supervising Physician: Luverne Aran  Patient Status: [facility] - Out-pt  History of Present Illness: Kristin [name] is a 58 y.o. female with history of asthma, hyperlipidemia, uterine fibroid, anemia, and newly diagnosed malignant neoplasm of the uterus.  Patient is followed by Dr. Lonn of oncology who has arranged for chemotherapy initiation 12/09/2023.  Patient will require Port-A-Cath for chemotherapy treatment.  Patient was referred to interventional radiology for image guided Port-A-Cath placement.  *** Patient is Full Code  Past Medical History:  Diagnosis Date   Anemia    Arthritis    knee, back; improved with weight loss   Asthma    Dry eyes    GERD (gastroesophageal reflux disease)    Hyperlipidemia    Uterine fibroid     Past Surgical History:  Procedure Laterality Date   COLONOSCOPY     DEBULKING N/A 11/02/2023   Procedure: DEBULKING, ABDOMINAL;  Surgeon: Viktoria Comer SAUNDERS, MD;  Location: [facility] ORS;  Service: Gynecology;  Laterality: N/A;  POSSIBLE DEBULKING   DILATATION & CURETTAGE/HYSTEROSCOPY WITH MYOSURE N/A 04/29/2016   Procedure: DILATATION & CURETTAGE/HYSTEROSCOPY WITH MYOSURE;  Surgeon: Dickie Carder, MD;  Location: [facility];  Service: Gynecology;  Laterality: N/A;   LAPAROSCOPY N/A 11/02/2023   Procedure: LAPAROSCOPY, DIAGNOSTIC;  Surgeon: Viktoria Comer SAUNDERS, MD;  Location: [facility];  Service: Gynecology;  Laterality: N/A;   LAPAROTOMY N/A 11/02/2023   Procedure: MINI LAPAROTOMY;  Surgeon: Viktoria Comer SAUNDERS, MD;  Location: [facility];  Service: Gynecology;  Laterality: N/A;   ROBOTIC ASSISTED TOTAL HYSTERECTOMY WITH BILATERAL SALPINGO OOPHERECTOMY N/A 11/02/2023   Procedure: HYSTERECTOMY, TOTAL, ROBOT-ASSISTED, LAPAROSCOPIC, WITH BILATERAL SALPINGO-OOPHORECTOMY;  Surgeon: Viktoria Comer SAUNDERS, MD;  Location: [facility];   Service: Gynecology;  Laterality: N/A;   TUBAL LIGATION     UPPER GI ENDOSCOPY      Allergies: Patient has no known allergies.  Medications: Prior to Admission medications   Medication Sig Start Date End Date Taking? Authorizing Provider  albuterol  (PROVENTIL ) (2.5 MG/3ML) 0.083% nebulizer solution Take 2.5 mg by nebulization every 6 (six) hours as needed for wheezing.    [provider]  albuterol  (VENTOLIN  HFA) 108 (90 Base) MCG/ACT inhaler Inhale 1-2 puffs into the lungs every 6 (six) hours as needed for wheezing or shortness of breath. 09/21/21   Stuart Vernell Norris, PA-C  apixaban  (ELIQUIS ) 2.5 MG TABS tablet Take 1 tablet (2.5 mg total) by mouth 2 (two) times daily for 11 days. 11/05/23 11/16/23  Eldonna Mays, MD  atorvastatin  (LIPITOR) 20 MG tablet Take 20 mg by mouth at bedtime.    [provider]  ferrous sulfate  325 (65 FE) MG tablet Take 1 tablet (325 mg total) by mouth daily. 09/13/23   Randol Simmonds, MD  Lifitegrast  (XIIDRA ) 5 % SOLN Place 1 drop into both eyes in the morning.    [provider]  Multiple Vitamin (MULTIVITAMIN WITH MINERALS) TABS tablet Take 1 tablet by mouth daily.    [provider]  Polyethyl Glycol-Propyl Glycol (SYSTANE OP) Place 1 drop into both eyes 3 (three) times daily as needed (dry/irritated eyes.).    [provider]  polyethylene glycol (MIRALAX  / GLYCOLAX ) 17 g packet Take 17 g by mouth daily. 11/06/23   Eldonna Mays, MD  senna-docusate (SENOKOT-S) 8.6-50 MG tablet Take 2 tablets by mouth at bedtime. For AFTER surgery, do not take if having diarrhea 10/13/23  Micheline Setter D, NP  traMADol  (ULTRAM ) 50 MG tablet Take 1 tablet (50 mg total) by mouth every 6 (six) hours as needed for severe pain (pain score 7-10). For AFTER surgery only, do not take and drive 3/87/74   Micheline Setter D, NP     Family History  Problem Relation Age of Onset   Breast cancer Neg Hx    Colon cancer Neg Hx    Pancreatic  cancer Neg Hx    Prostate cancer Neg Hx    Ovarian cancer Neg Hx    Endometrial cancer Neg Hx     Social History   Socioeconomic History   Marital status: Married    Spouse name: Not on file   Number of children: Not on file   Years of education: Not on file   Highest education level: Not on file  Occupational History   Not on file  Tobacco Use   Smoking status: Never    Passive exposure: Never   Smokeless tobacco: Never  Vaping Use   Vaping status: Never Used  Substance and Sexual Activity   Alcohol  use: No   Drug use: No   Sexual activity: Not on file  Other Topics Concern   Not on file  Social History Narrative   Not on file   Social Drivers of Health   Financial Resource Strain: Low Risk  (10/17/2023)   Received from [facility]   Overall Financial Resource Strain (CARDIA)    Difficulty of Paying Living Expenses: Not hard at all  Food Insecurity: No Food Insecurity (11/02/2023)   Hunger Vital Sign    Worried About Running Out of Food in the Last Year: Never true    Ran Out of Food in the Last Year: Never true  Transportation Needs: No Transportation Needs (11/02/2023)   PRAPARE - Administrator, Civil Service (Medical): No    Lack of Transportation (Non-Medical): No  Physical Activity: Not on file  Stress: Not on file  Social Connections: Not on file     Review of Systems: A 12 point ROS discussed and pertinent positives are indicated in the HPI above.  All other systems are negative.  Review of Systems  Vital Signs: There were no vitals taken for this visit.  Advance Care Plan: The advanced care place/surrogate decision maker was discussed at the time of visit and the patient did not wish to discuss or was not able to name a surrogate decision maker or provide an advance care plan.  Physical Exam  Imaging: DG Abd 1 View Addendum Date: 11/02/2023 ADDENDUM REPORT: 11/02/2023 18:14 ADDENDUM: Unable to reach OR at time of dictation. Per PRA  Shona Slack, OR nurse states that lap sponge was found on the floor. No more missing count. Electronically Signed   By: Morgane  Naveau M.D.   On: 11/02/2023 18:14   Result Date: 11/02/2023 CLINICAL DATA:  [id number] Surgery, elective 886218 gyn laparoscopy. Miscount of sponge in OR - hysterectomy surgery EXAM: ABDOMEN - 1 VIEW COMPARISON:  CT abdomen pelvis 07/14/2023 FINDINGS: Enteric tube with tip and side port overlying the expected region of the gastric lumen. The bowel gas pattern is normal. Trace free gas noted along the right abdomen likely postsurgical in etiology. Vertical linear density along the right upper abdomen and chest wall likely external to patient. No radio-opaque calculi or other significant radiographic abnormality are seen. IMPRESSION: 1. No unexpected retained radiopaque foreign body. 2. Enteric tube in good position. 3. Nonobstructive bowel gas  pattern. 4. Trace free gas noted along the right abdomen likely postsurgical in etiology. Electronically Signed: By: Morgane  Naveau M.D. On: 11/02/2023 18:05    Labs:  CBC: Recent Labs    11/02/23 1353 11/02/23 1520 11/02/23 1808 11/03/23 0444 11/04/23 0445 11/05/23 0433  WBC 9.9  --   --  11.0* 9.6 8.1  HGB 9.0*   < > 9.2* 10.5* 10.3* 10.2*  HCT 31.1*   < > 27.0* 35.0* 35.2* 35.0*  PLT 890*  --   --  750* 732* 679*   < > = values in this interval not displayed.    COAGS: No results for input(s): INR, APTT in the last 8760 hours.  BMP: Recent Labs    11/02/23 1353 11/02/23 1520 11/02/23 1808 11/03/23 0444 11/04/23 0445 11/05/23 0433  NA 138   < > 139 137 139 141  K 2.9*   < > 3.6 3.6 3.5 3.7  CL 101  --   --  104 101 104  CO2 22  --   --  25 23 25   GLUCOSE 112*  --   --  136* 79 98  BUN 7  --   --  <5* 8 9  CALCIUM  8.9  --   --  8.5* 8.4* 9.0  CREATININE 0.76  --   --  0.44 0.69 0.70  GFRNONAA >60  --   --  >60 >60 >60   < > = values in this interval not displayed.    LIVER FUNCTION TESTS: Recent Labs     09/13/23 1756 10/31/23 1513  BILITOT 0.8 0.4  AST 17 14*  ALT 12 13  ALKPHOS 82 113  PROT 7.9 8.0  ALBUMIN  2.4* 2.3*    TUMOR MARKERS: No results for input(s): AFPTM, CEA, CA199, CHROMGRNA in the last 8760 hours.  Assessment and Plan:  Patient is with Malignant neoplasm of uterus; chemotherapy initiation scheduled for image guided portacatheter placement 12/02/2023.  Risks and benefits of image guided port-a-catheter placement was discussed with the patient including, but not limited to bleeding, infection, pneumothorax, or fibrin sheath development and need for additional procedures.  All of the patient's questions were answered, patient is agreeable to proceed. Consent signed and in chart.  Thank you for allowing our service to participate in BETTYANN BIRCHLER 's care.  Electronically Signed: Lavanda JAYSON Jurist, PA-C   11/30/2023, 2:05 PM    I spent a total of  30 Minutes   in face to face in clinical consultation, greater than 50% of which was counseling/coordinating care for image guided portacatheter placement.

## 2023-12-01 ENCOUNTER — Ambulatory Visit (HOSPITAL_COMMUNITY)
Admission: RE | Admit: 2023-12-01 | Discharge: 2023-12-01 | Disposition: A | Discharge status: Home / Self Care | Source: Ambulatory Visit | Attending: Hematology and Oncology | Admitting: Hematology and Oncology

## 2023-12-01 ENCOUNTER — Telehealth: Payer: Self-pay | Admitting: *Deleted

## 2023-12-01 ENCOUNTER — Other Ambulatory Visit (HOSPITAL_COMMUNITY): Payer: Self-pay | Admitting: Student

## 2023-12-01 DIAGNOSIS — K7689 Other specified diseases of liver: Secondary | ICD-10-CM | POA: Diagnosis not present

## 2023-12-01 DIAGNOSIS — C55 Malignant neoplasm of uterus, part unspecified: Secondary | ICD-10-CM | POA: Insufficient documentation

## 2023-12-01 DIAGNOSIS — K5900 Constipation, unspecified: Secondary | ICD-10-CM | POA: Diagnosis not present

## 2023-12-01 DIAGNOSIS — R1900 Intra-abdominal and pelvic swelling, mass and lump, unspecified site: Secondary | ICD-10-CM | POA: Diagnosis not present

## 2023-12-01 MED ORDER — IOHEXOL 300 MG/ML  SOLN
100.0000 mL | Freq: Once | INTRAMUSCULAR | Status: AC | PRN
  Administered 2023-12-01: 100 mL via INTRAVENOUS

## 2023-12-01 MED ORDER — IOHEXOL 9 MG/ML PO SOLN
1000.0000 mL | ORAL | Status: AC
  Administered 2023-12-01: 1000 mL via ORAL

## 2023-12-01 MED ORDER — IOHEXOL 9 MG/ML PO SOLN
ORAL | Status: AC
  Filled 2023-12-01: qty 1000

## 2023-12-01 NOTE — H&P (Shared)
 Chief Complaint: Uterine cancer - IR consulted for portacatheter placement for chemotherapy  Referring Provider(s): Lonn Hicks, MD   Supervising Physician: Luverne Aran  Patient Status: [name] - Out-pt  History of Present Illness: Kristin [name] is a 58 y.o. female with paramedical history of anemia, arthritis, GERD, hyperlipidemia, uterine fibroids.  Patient has been following with Dr. Viktoria of gynecologic oncology, initially seen back in March 2025 for pelvic mass seen on CT imaging due to initial symptoms of lower abdominal fullness and discomfort.  Patient then had ED visit in May 2025 where she was noted to have weight loss, fever and tachycardia.  Chest x-ray at that visit showed a 4 mm opacity in the lateral aspect of the right upper lobe.  Chest CT from 10/24/2023 no acute findings.  Patient ultimately had laparoscopic total hysterectomy with bilateral salpingo-oophorectomy on 11/02/23.  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  Patient was then seen by Dr. Lonn of oncology on 11/25/2023 with plan to proceed with chemotherapy.  IR now consulted for portacatheter placement.  *** Patient is Full Code  Past Medical History:  Diagnosis Date   Anemia    Arthritis    knee, back; improved with weight loss   Asthma    Dry eyes    GERD (gastroesophageal reflux disease)    Hyperlipidemia    Uterine fibroid     Past Surgical History:  Procedure Laterality Date   COLONOSCOPY     DEBULKING N/A 11/02/2023   Procedure: DEBULKING, ABDOMINAL;  Surgeon: Viktoria Comer SAUNDERS, MD;  Location: [facility];  Service: Gynecology;  Laterality: N/A;  POSSIBLE DEBULKING   DILATATION & CURETTAGE/HYSTEROSCOPY WITH MYOSURE N/A 04/29/2016   Procedure: DILATATION & CURETTAGE/HYSTEROSCOPY WITH MYOSURE;  Surgeon: Dickie Carder, MD;  Location: [facility];  Service: Gynecology;  Laterality: N/A;   LAPAROSCOPY N/A 11/02/2023   Procedure: LAPAROSCOPY, DIAGNOSTIC;  Surgeon:  Viktoria Comer SAUNDERS, MD;  Location: [facility];  Service: Gynecology;  Laterality: N/A;   LAPAROTOMY N/A 11/02/2023   Procedure: MINI LAPAROTOMY;  Surgeon: Viktoria Comer SAUNDERS, MD;  Location: [facility];  Service: Gynecology;  Laterality: N/A;   ROBOTIC ASSISTED TOTAL HYSTERECTOMY WITH BILATERAL SALPINGO OOPHERECTOMY N/A 11/02/2023   Procedure: HYSTERECTOMY, TOTAL, ROBOT-ASSISTED, LAPAROSCOPIC, WITH BILATERAL SALPINGO-OOPHORECTOMY;  Surgeon: Viktoria Comer SAUNDERS, MD;  Location: [facility];  Service: Gynecology;  Laterality: N/A;   TUBAL LIGATION     UPPER GI ENDOSCOPY      Allergies: Patient has no known allergies.  Medications: Prior to Admission medications   Medication Sig Start Date End Date Taking? Authorizing Provider  albuterol  (PROVENTIL ) (2.5 MG/3ML) 0.083% nebulizer solution Take 2.5 mg by nebulization every 6 (six) hours as needed for wheezing.    [provider]  albuterol  (VENTOLIN  HFA) 108 (90 Base) MCG/ACT inhaler Inhale 1-2 puffs into the lungs every 6 (six) hours as needed for wheezing or shortness of breath. 09/21/21   Stuart Vernell Norris, PA-C  apixaban  (ELIQUIS ) 2.5 MG TABS tablet Take 1 tablet (2.5 mg total) by mouth 2 (two) times daily for 11 days. 11/05/23 11/16/23  Eldonna Mays, MD  atorvastatin  (LIPITOR) 20 MG tablet Take 20 mg by mouth at bedtime.    [provider]  ferrous sulfate  325 (65 FE) MG tablet Take 1 tablet (325 mg total) by mouth daily. 09/13/23   Randol Simmonds, MD  Lifitegrast  (XIIDRA ) 5 % SOLN Place 1 drop into both eyes in the morning.    [provider]  Multiple Vitamin (  MULTIVITAMIN WITH MINERALS) TABS tablet Take 1 tablet by mouth daily.    [provider]  Polyethyl Glycol-Propyl Glycol (SYSTANE OP) Place 1 drop into both eyes 3 (three) times daily as needed (dry/irritated eyes.).    [provider]  polyethylene glycol (MIRALAX  / GLYCOLAX ) 17 g packet Take 17 g by mouth daily. 11/06/23   Eldonna Mays, MD   senna-docusate (SENOKOT-S) 8.6-50 MG tablet Take 2 tablets by mouth at bedtime. For AFTER surgery, do not take if having diarrhea 10/13/23   Cross, Eleanor D, NP  traMADol  (ULTRAM ) 50 MG tablet Take 1 tablet (50 mg total) by mouth every 6 (six) hours as needed for severe pain (pain score 7-10). For AFTER surgery only, do not take and drive 3/87/74   Micheline Eleanor D, NP     Family History  Problem Relation Age of Onset   Breast cancer Neg Hx    Colon cancer Neg Hx    Pancreatic cancer Neg Hx    Prostate cancer Neg Hx    Ovarian cancer Neg Hx    Endometrial cancer Neg Hx     Social History   Socioeconomic History   Marital status: Married    Spouse name: Not on file   Number of children: Not on file   Years of education: Not on file   Highest education level: Not on file  Occupational History   Not on file  Tobacco Use   Smoking status: Never    Passive exposure: Never   Smokeless tobacco: Never  Vaping Use   Vaping status: Never Used  Substance and Sexual Activity   Alcohol  use: No   Drug use: No   Sexual activity: Not on file  Other Topics Concern   Not on file  Social History Narrative   Not on file   Social Drivers of Health   Financial Resource Strain: Low Risk  (10/17/2023)   [facility]   Overall Financial Resource Strain (CARDIA)    Difficulty of Paying Living Expenses: Not hard at all  Food Insecurity: No Food Insecurity (11/02/2023)   Hunger Vital Sign    Worried About Running Out of Food in the Last Year: Never true    Ran Out of Food in the Last Year: Never true  Transportation Needs: No Transportation Needs (11/02/2023)   PRAPARE - Administrator, Civil Service (Medical): No    Lack of Transportation (Non-Medical): No  Physical Activity: Not on file  Stress: Not on file  Social Connections: Not on file     Review of Systems: A 12 point ROS discussed and pertinent positives are indicated in the HPI above.  All other systems  are negative.  Review of Systems  Vital Signs: There were no vitals taken for this visit.  Advance Care Plan: The advanced care place/surrogate decision maker was discussed at the time of visit and the patient did not wish to discuss or was not able to name a surrogate decision maker or provide an advance care plan.  Physical Exam  Imaging: DG Abd 1 View Addendum Date: 11/02/2023 ADDENDUM REPORT: 11/02/2023 18:14 ADDENDUM: Unable to reach OR at time of dictation. Per PRA Shona Slack, OR nurse states that lap sponge was found on the floor. No more missing count. Electronically Signed   By: Morgane  Naveau M.D.   On: 11/02/2023 18:14   Result Date: 11/02/2023 CLINICAL DATA:  [id number] Surgery, elective [id number] gyn laparoscopy. Miscount of sponge in OR - hysterectomy  surgery EXAM: ABDOMEN - 1 VIEW COMPARISON:  CT abdomen pelvis 07/14/2023 FINDINGS: Enteric tube with tip and side port overlying the expected region of the gastric lumen. The bowel gas pattern is normal. Trace free gas noted along the right abdomen likely postsurgical in etiology. Vertical linear density along the right upper abdomen and chest wall likely external to patient. No radio-opaque calculi or other significant radiographic abnormality are seen. IMPRESSION: 1. No unexpected retained radiopaque foreign body. 2. Enteric tube in good position. 3. Nonobstructive bowel gas pattern. 4. Trace free gas noted along the right abdomen likely postsurgical in etiology. Electronically Signed: By: Morgane  Naveau M.D. On: 11/02/2023 18:05    Labs:  CBC: Recent Labs    11/02/23 1353 11/02/23 1520 11/02/23 1808 11/03/23 0444 11/04/23 0445 11/05/23 0433  WBC 9.9  --   --  11.0* 9.6 8.1  HGB 9.0*   < > 9.2* 10.5* 10.3* 10.2*  HCT 31.1*   < > 27.0* 35.0* 35.2* 35.0*  PLT 890*  --   --  750* 732* 679*   < > = values in this interval not displayed.    COAGS: No results for input(s): INR, APTT in the last 8760 hours.  BMP: Recent  Labs    11/02/23 1353 11/02/23 1520 11/02/23 1808 11/03/23 0444 11/04/23 0445 11/05/23 0433  NA 138   < > 139 137 139 141  K 2.9*   < > 3.6 3.6 3.5 3.7  CL 101  --   --  104 101 104  CO2 22  --   --  25 23 25   GLUCOSE 112*  --   --  136* 79 98  BUN 7  --   --  <5* 8 9  CALCIUM  8.9  --   --  8.5* 8.4* 9.0  CREATININE 0.76  --   --  0.44 0.69 0.70  GFRNONAA >60  --   --  >60 >60 >60   < > = values in this interval not displayed.    LIVER FUNCTION TESTS: Recent Labs    09/13/23 1756 10/31/23 1513  BILITOT 0.8 0.4  AST 17 14*  ALT 12 13  ALKPHOS 82 113  PROT 7.9 8.0  ALBUMIN  2.4* 2.3*    TUMOR MARKERS: No results for input(s): AFPTM, CEA, CA199, CHROMGRNA in the last 8760 hours.  Assessment and Plan:  Kristin [name] is a 58 y.o. female with paramedical history of anemia, arthritis, GERD, hyperlipidemia, uterine fibroids.  Patient has been following with Dr. Viktoria of gynecologic oncology, initially seen back in March 2025 for pelvic mass seen on CT imaging due to initial symptoms of lower abdominal fullness and discomfort.  Patient then had ED visit in May 2025 where she was noted to have weight loss, fever and tachycardia.  Chest x-ray at that visit showed a 4 mm opacity in the lateral aspect of the right upper lobe.  Chest CT from 10/24/2023 no acute findings.  Patient ultimately had laparoscopic total hysterectomy with bilateral salpingo-oophorectomy on 11/02/23.  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  Patient was then seen by Dr. Lonn of oncology on 11/25/2023 with plan to proceed with chemotherapy.  IR now consulted for portacatheter placement.   Risks and benefits of image-guided Port-a-catheter placement were discussed with the patient including, but not limited to bleeding, infection, pneumothorax, or fibrin sheath development and need for additional procedures. All of the patient's questions were answered, patient is  agreeable to proceed. Consent  signed and in chart.   Thank you for allowing our service to participate in Kristin [name] 's care.  Electronically Signed: Kimble VEAR Clas, PA-C   12/01/2023, 3:35 PM      I spent a total of  30 Minutes   in face to face in clinical consultation, greater than 50% of which was counseling/coordinating care for portacatheter placement.

## 2023-12-01 NOTE — Telephone Encounter (Signed)
 Spoke with Kristin [name] who called the office stating she noticed some bright red blood on toilet tissue only after she voided this morning.  Pt denies fever, chills, pain and or discharge, also denies all urinary symptoms. Pt reports following all activity restrictions and hasn't put anything in the vagina. Pt also reports no straining and is having regular bowel movements. Pt is not wearing a peri pad and hasn't noticed any blood in her under pants. Advised patient to monitor and if the bleeding increases to call the office back. And continue to follow all activity restrictions. Message relayed to providers and office will call back with any new recommendations. Pt verbalized understanding and thanked the office.

## 2023-12-02 ENCOUNTER — Ambulatory Visit (HOSPITAL_COMMUNITY)
Admission: RE | Admit: 2023-12-02 | Discharge: 2023-12-02 | Disposition: A | Discharge status: Home / Self Care | Source: Ambulatory Visit | Attending: Hematology and Oncology | Admitting: Hematology and Oncology

## 2023-12-02 ENCOUNTER — Encounter (HOSPITAL_COMMUNITY): Payer: Self-pay

## 2023-12-02 DIAGNOSIS — C541 Malignant neoplasm of endometrium: Secondary | ICD-10-CM | POA: Diagnosis not present

## 2023-12-02 DIAGNOSIS — C55 Malignant neoplasm of uterus, part unspecified: Secondary | ICD-10-CM | POA: Diagnosis not present

## 2023-12-02 HISTORY — PX: IR IMAGING GUIDED PORT INSERTION: IMG5740

## 2023-12-02 MED ORDER — LIDOCAINE HCL 1 % IJ SOLN
INTRAMUSCULAR | Status: AC
  Filled 2023-12-02: qty 20

## 2023-12-02 MED ORDER — SODIUM CHLORIDE 0.9 % IV SOLN: INTRAVENOUS | Status: DC

## 2023-12-02 MED ORDER — FENTANYL CITRATE (PF) 100 MCG/2ML IJ SOLN
INTRAMUSCULAR | Status: AC | PRN
  Administered 2023-12-02: 50 ug via INTRAVENOUS

## 2023-12-02 MED ORDER — MIDAZOLAM HCL 2 MG/2ML IJ SOLN
INTRAMUSCULAR | Status: AC
  Filled 2023-12-02: qty 2

## 2023-12-02 MED ORDER — HEPARIN SOD (PORK) LOCK FLUSH 100 UNIT/ML IV SOLN
500.0000 [IU] | Freq: Once | INTRAVENOUS | Status: AC
  Administered 2023-12-02: 500 [IU] via INTRAVENOUS

## 2023-12-02 MED ORDER — FENTANYL CITRATE (PF) 100 MCG/2ML IJ SOLN
INTRAMUSCULAR | Status: AC
  Filled 2023-12-02: qty 2

## 2023-12-02 MED ORDER — HEPARIN SOD (PORK) LOCK FLUSH 100 UNIT/ML IV SOLN
INTRAVENOUS | Status: AC
  Filled 2023-12-02: qty 5

## 2023-12-02 MED ORDER — MIDAZOLAM HCL 2 MG/2ML IJ SOLN
INTRAMUSCULAR | Status: AC | PRN
  Administered 2023-12-02: 1 mg via INTRAVENOUS

## 2023-12-02 MED ORDER — LIDOCAINE HCL 1 % IJ SOLN
20.0000 mL | Freq: Once | INTRAMUSCULAR | Status: AC
  Administered 2023-12-02: 17 mL via INTRADERMAL

## 2023-12-02 NOTE — Procedures (Signed)
 Interventional Radiology Procedure Note  Procedure: Single Lumen Power Port Placement    Access:  Right IJ vein.  Findings: Catheter tip positioned at SVC/RA junction. Port is ready for immediate use.   Complications: None  EBL: < 10 mL  Recommendations:  - Ok to shower in 24 hours - Do not submerge for 7 days - Routine line care   [name] T. Fredia Sorrow, M.D Pager:  [phone]

## 2023-12-02 NOTE — Sedation Documentation (Signed)
 RN Jacquez Sheetz pulled 2 mg Versed  and 100 mcg Fentanyl  in Ir room. Pt. Received 2 mg Versed  and 100 mcg Fentanyl  throughout the procedure.

## 2023-12-02 NOTE — Discharge Instructions (Signed)
 Implanted Port Insertion, Care After  The following information offers guidance on how to care for yourself after your procedure. Your health care provider may also give you more specific instructions. If you have problems or questions, contact your health care provider.  What can I expect after the procedure? After the procedure, it is common to have: Discomfort at the port insertion site. Bruising on the skin over the port. This should improve over 3-4 days.   Urgent needs - Interventional Radiology, clinic [phone] (mon-fri 8-5).   Wound - May remove dressing and shower in 24 to 48 hours.  Keep site clean and dry.  Replace with bandaid as needed.  Do not submerge in tub or water until site healing well. If closed with glue, glue will flake off on its own.   If ordered by your provider, may start Emla cream (or any other creams ointments or lotions) in 2 weeks or after incision is healed. Port is ready for use immediately.   After completion of treatment, your provider should have you set up for monthly port flushes.   Follow these instructions at home: [facility] care After your port is placed, you will get a manufacturer's information card. The card has information about your port. Keep this card with you at all times. Take care of the port as told by your health care provider. Ask your health care provider if you or a family member can get training for taking care of the port at home. A home health care nurse will be be available to help care for the port. Make sure to remember what type of port you have. Incision care     Follow instructions from your health care provider about how to take care of your port insertion site. Make sure you: Wash your hands with soap and water for at least 20 seconds before and after you change your bandage (dressing). If soap and water are not available, use hand sanitizer. Change your dressing as told by your health care provider. Leave stitches  (sutures), skin glue, or adhesive strips in place. These skin closures may need to stay in place for 2 weeks or longer. If adhesive strip edges start to loosen and curl up, you may trim the loose edges. Do not remove adhesive strips completely unless your health care provider tells you to do that. Check your port insertion site every day for signs of infection. Check for: Redness, swelling, or pain. Fluid or blood. Warmth. Pus or a bad smell. Activity Return to your normal activities as told by your health care provider. Ask your health care provider what activities are safe for you. You may have to avoid lifting. Ask your health care provider how much you can safely lift. General instructions Take over-the-counter and prescription medicines only as told by your health care provider. Do not take baths, swim, or use a hot tub until your health care provider approves. Ask your health care provider if you may take showers. You may only be allowed to take sponge baths. If you were given a sedative during the procedure, it can affect you for several hours. Do not drive or operate machinery until your health care provider says that it is safe. Wear a medical alert bracelet in case of an emergency. This will tell any health care providers that you have a port. Keep all follow-up visits. This is important. Contact a health care provider if: You cannot flush your port with saline as directed, or you cannot  draw blood from the port. You have a fever or chills. You have redness, swelling, or pain around your port insertion site. You have fluid or blood coming from your port insertion site. Your port insertion site feels warm to the touch. You have pus or a bad smell coming from the port insertion site. Get help right away if: You have chest pain or shortness of breath. You have bleeding from your port that you cannot control. These symptoms may be an emergency. Get help right away. Call 911. Do not  wait to see if the symptoms will go away. Do not drive yourself to the hospital. Summary Take care of the port as told by your health care provider. Keep the manufacturer's information card with you at all times. Change your dressing as told by your health care provider. Contact a health care provider if you have a fever or chills or if you have redness, swelling, or pain around your port insertion site. Keep all follow-up visits. This information is not intended to replace advice given to you by your health care provider. Make sure you discuss any questions you have with your health care provider. Document Revised: 10/21/2020 Document Reviewed: 10/21/2020 Elsevier Patient Education  2023 Elsevier Inc[employer]    Moderate Conscious Sedation  Adult  Care After (English)  After the procedure, it is common to have: Sleepiness for a few hours. Impaired judgment for a few hours. Trouble with balance. Nausea or vomiting if you eat too soon. Follow these instructions at home: For the time period you were told by your health care provider:  Rest. Do not participate in activities where you could fall or become injured. Do not drive or use machinery. Do not drink alcohol. Do not take sleeping pills or medicines that cause drowsiness. Do not make important decisions or sign legal documents. Do not take care of children on your own. Eating and drinking Follow instructions from your health care provider about what you may eat and drink. Drink enough fluid to keep your urine pale yellow. If you vomit: Drink clear fluids slowly and in small amounts as you are able. Clear fluids include water, ice chips, low-calorie sports drinks, and fruit juice that has water added to it (diluted fruit juice). Eat light and bland foods in small amounts as you are able. These foods include bananas, applesauce, rice, lean meats, toast, and crackers. General instructions Take over-the-counter and prescription medicines  only as told by your health care provider. Have a responsible adult stay with you for the time you are told. Do not use any products that contain nicotine or tobacco. These products include cigarettes, chewing tobacco, and vaping devices, such as e-cigarettes. If you need help quitting, ask your health care provider. Return to your normal activities as told by your health care provider. Ask your health care provider what activities are safe for you. Your health care provider may give you more instructions. Make sure you know what you can and cannot do. Contact a health care provider if: You are still sleepy or having trouble with balance after 24 hours. You feel light-headed. You vomit every time you eat or drink. You get a rash. You have a fever. You have redness or swelling around the IV site. Get help right away if: You have trouble breathing. You start to feel confused at home. These symptoms may be an emergency. Get help right away. Call 911. Do not wait to see if the symptoms will go away. Do not  drive yourself to the hospital. This information is not intended to replace advice given to you by your health care provider. Make sure you discuss any questions you have with your health care provider.

## 2023-12-08 ENCOUNTER — Inpatient Hospital Stay

## 2023-12-08 ENCOUNTER — Encounter: Payer: Self-pay | Admitting: Hematology and Oncology

## 2023-12-08 ENCOUNTER — Inpatient Hospital Stay: Attending: Gynecologic Oncology | Admitting: Hematology and Oncology

## 2023-12-08 VITALS — BP 160/79 | HR 115 | Temp 99.0°F | Resp 18 | Ht 64.0 in | Wt 158.6 lb

## 2023-12-08 DIAGNOSIS — D509 Iron deficiency anemia, unspecified: Secondary | ICD-10-CM | POA: Diagnosis not present

## 2023-12-08 DIAGNOSIS — Z5111 Encounter for antineoplastic chemotherapy: Secondary | ICD-10-CM | POA: Diagnosis not present

## 2023-12-08 DIAGNOSIS — C549 Malignant neoplasm of corpus uteri, unspecified: Secondary | ICD-10-CM

## 2023-12-08 DIAGNOSIS — Z90722 Acquired absence of ovaries, bilateral: Secondary | ICD-10-CM | POA: Insufficient documentation

## 2023-12-08 DIAGNOSIS — Z7901 Long term (current) use of anticoagulants: Secondary | ICD-10-CM | POA: Insufficient documentation

## 2023-12-08 DIAGNOSIS — Z90710 Acquired absence of both cervix and uterus: Secondary | ICD-10-CM | POA: Diagnosis not present

## 2023-12-08 DIAGNOSIS — Z79899 Other long term (current) drug therapy: Secondary | ICD-10-CM | POA: Diagnosis not present

## 2023-12-08 DIAGNOSIS — C541 Malignant neoplasm of endometrium: Secondary | ICD-10-CM | POA: Insufficient documentation

## 2023-12-08 DIAGNOSIS — C55 Malignant neoplasm of uterus, part unspecified: Secondary | ICD-10-CM | POA: Diagnosis not present

## 2023-12-08 MED ORDER — LIDOCAINE-PRILOCAINE 2.5-2.5 % EX CREA: TOPICAL_CREAM | CUTANEOUS | 3 refills | Status: AC

## 2023-12-08 MED ORDER — PROCHLORPERAZINE MALEATE 10 MG PO TABS: 10.0000 mg | ORAL_TABLET | Freq: Four times a day (QID) | ORAL | 1 refills | Status: AC | PRN

## 2023-12-08 MED ORDER — ONDANSETRON HCL 8 MG PO TABS
8.0000 mg | ORAL_TABLET | Freq: Three times a day (TID) | ORAL | 1 refills | Status: AC | PRN
Start: 2023-12-08 — End: ?

## 2023-12-08 NOTE — Assessment & Plan Note (Addendum)
 She will continue oral iron supplement Plan to repeat labs and iron studies next week

## 2023-12-08 NOTE — Assessment & Plan Note (Signed)
 The patient was diagnosed with advanced age uterine cancer with abnormal imaging in May 2025 and subsequently underwent surgery in July 2025 Pathology: High-grade sarcoma, further evaluation pending tertiary referral  Review of CT imaging from July 2025 which showed no evidence of residual disease We discussed risk, benefits, side effects of doxorubicin versus combination of gemcitabine with Taxotere Ultimately, she elected for single agent doxorubicin I will order echocardiogram She had port placed We will get her started on treatment next week I recommend 6 cycles of treatment

## 2023-12-08 NOTE — Progress Notes (Signed)
 [facility] OFFICE PROGRESS NOTE  Patient Care Team: Emilio Joesph [name] as PCP - General (Physician Assistant)  Assessment & Plan Sarcoma of uterus [facility]) The patient was diagnosed with advanced age uterine cancer with abnormal imaging in May 2025 and subsequently underwent surgery in July 2025 Pathology: High-grade sarcoma, further evaluation pending tertiary referral  Review of CT imaging from July 2025 which showed no evidence of residual disease We discussed risk, benefits, side effects of doxorubicin versus combination of gemcitabine with Taxotere Ultimately, she elected for single agent doxorubicin I will order echocardiogram She had port placed We will get her started on treatment next week I recommend 6 cycles of treatment Iron deficiency anemia, unspecified iron deficiency anemia type She will continue oral iron supplement Plan to repeat labs and iron studies next week  Orders Placed This Encounter  Procedures   CBC with Differential (Cancer Center Only)    Standing Status:   Future    Expected Date:   12/16/2023    Expiration Date:   12/15/2024   CMP (Cancer Center only)    Standing Status:   Future    Expected Date:   12/16/2023    Expiration Date:   12/15/2024   CBC with Differential (Cancer Center Only)    Standing Status:   Future    Expected Date:   01/06/2024    Expiration Date:   01/05/2025   CMP (Cancer Center only)    Standing Status:   Future    Expected Date:   01/06/2024    Expiration Date:   01/05/2025   CBC with Differential (Cancer Center Only)    Standing Status:   Future    Expected Date:   01/27/2024    Expiration Date:   01/26/2025   CMP (Cancer Center only)    Standing Status:   Future    Expected Date:   01/27/2024    Expiration Date:   01/26/2025   CBC with Differential (Cancer Center Only)    Standing Status:   Future    Expected Date:   02/17/2024    Expiration Date:   02/16/2025   CMP (Cancer Center only)    Standing Status:    Future    Expected Date:   02/17/2024    Expiration Date:   02/16/2025   CBC with Differential (Cancer Center Only)    Standing Status:   Future    Expected Date:   03/09/2024    Expiration Date:   03/09/2025   CMP (Cancer Center only)    Standing Status:   Future    Expected Date:   03/09/2024    Expiration Date:   03/09/2025   CBC with Differential (Cancer Center Only)    Standing Status:   Future    Expected Date:   03/30/2024    Expiration Date:   03/30/2025   CMP (Cancer Center only)    Standing Status:   Future    Expected Date:   03/30/2024    Expiration Date:   03/30/2025   Iron and Iron Binding Capacity ([facility] only)    Standing Status:   Future    Expected Date:   12/16/2023    Expiration Date:   12/07/2024   Ferritin    Standing Status:   Future    Expected Date:   12/16/2023    Expiration Date:   12/07/2024   ECHOCARDIOGRAM COMPLETE    Standing Status:   Future    Expected Date:  12/13/2023    Expiration Date:   12/07/2024    Where should this test be performed:   [facility]    Perflutren DEFINITY (image enhancing agent) should be administered unless hypersensitivity or allergy exist:   Administer Perflutren    Reason for exam-Echo:   Chemo  Z09     Almarie Bedford, MD  INTERVAL HISTORY: she returns for surveillance follow-up and review of test results She is healing well and doing well We reviewed CT imaging and discussed treatment options  PHYSICAL EXAMINATION: ECOG PERFORMANCE STATUS: 0 - Asymptomatic  Vitals:   12/08/23 1408  BP: (!) 160/79  Pulse: (!) 115  Resp: 18  Temp: 99 F (37.2 C)  SpO2: 100%   Filed Weights   12/08/23 1408  Weight: 158 lb 9.6 oz (71.9 kg)    Relevant data reviewed during this visit included CT imaging from July 2025

## 2023-12-09 ENCOUNTER — Other Ambulatory Visit: Payer: Self-pay

## 2023-12-09 NOTE — Progress Notes (Signed)
 Pharmacist Chemotherapy Monitoring - Initial Assessment    Anticipated start date: 12/16/23   The following has been reviewed per standard work regarding the patient's treatment regimen: The patient's diagnosis, treatment plan and drug doses, and organ/hematologic function Lab orders and baseline tests specific to treatment regimen  The treatment plan start date, drug sequencing, and pre-medications Prior authorization status  Patient's documented medication list, including drug-drug interaction screen and prescriptions for anti-emetics and supportive care specific to the treatment regimen The drug concentrations, fluid compatibility, administration routes, and timing of the medications to be used The patient's access for treatment and lifetime cumulative dose history, if applicable  The patient's medication allergies and previous infusion related reactions, if applicable   Changes made to treatment plan:  N/A  Follow up needed:  ECHO results   Kristin [name], RPH, 12/09/2023  10:31 AM

## 2023-12-12 ENCOUNTER — Telehealth: Payer: Self-pay

## 2023-12-12 DIAGNOSIS — C549 Malignant neoplasm of corpus uteri, unspecified: Secondary | ICD-10-CM

## 2023-12-12 NOTE — Telephone Encounter (Signed)
 [id number]- Effectiveness of Out-of Pocket Cost Communication and Financial Navigation (CostCOM) in Cancer Patients:    Patient Kristin [name] was identified by Dr. Lonn as a potential candidate for the above listed study.  This Clinical Research Coordinator spoke with REIGAN TOLLIVER, [id number], via phone in a manner that ensures patient privacy to discuss participation in the above listed research study.  A copy of the informed consent document and separate HIPAA Authorization was provided to the patient via email.  Patient reads, speaks, and understands [place].   Patient was provided with contact information of this Coordinator and encouraged to contact the research team with any questions.  Approximately 15 minutes were spent with the patient reviewing the informed consent documents.  Patient was encouraged to review at their convenience with their support network, including other care providers. Will follow patient concerning their study interest.  Laury Quale, MPH  Clinical Research Coordinator

## 2023-12-13 ENCOUNTER — Telehealth: Payer: Self-pay | Admitting: Oncology

## 2023-12-13 ENCOUNTER — Ambulatory Visit (HOSPITAL_COMMUNITY)
Admission: RE | Admit: 2023-12-13 | Discharge: 2023-12-13 | Disposition: A | Discharge status: Home / Self Care | Source: Ambulatory Visit | Attending: Hematology and Oncology | Admitting: Hematology and Oncology

## 2023-12-13 ENCOUNTER — Telehealth: Payer: Self-pay

## 2023-12-13 DIAGNOSIS — C55 Malignant neoplasm of uterus, part unspecified: Secondary | ICD-10-CM | POA: Diagnosis not present

## 2023-12-13 DIAGNOSIS — I349 Nonrheumatic mitral valve disorder, unspecified: Secondary | ICD-10-CM

## 2023-12-13 LAB — ECHOCARDIOGRAM COMPLETE
AR max vel: 2.3 cm2
AV Area VTI: 2.36 cm2
AV Area mean vel: 2.32 cm2
AV Mean grad: 5 mmHg
AV Peak grad: 10.8 mmHg
Ao pk vel: 1.64 m/s
Area-P 1/2: 2.72 cm2
S' Lateral: 2.58 cm

## 2023-12-13 NOTE — Telephone Encounter (Signed)
 Kristin [name] called and said she has cough with a scratchy throat and a runny nose that started on Sunday.  She denies having a fever.  She is wondering if she is still ok to start chemo on Friday.

## 2023-12-13 NOTE — Telephone Encounter (Signed)
 Spoke with patient and moved appointment from 4pm to 3:15pm.. patient confirmed.[name]

## 2023-12-14 ENCOUNTER — Encounter: Payer: Self-pay | Admitting: Hematology and Oncology

## 2023-12-14 NOTE — Telephone Encounter (Signed)
 Kristin [name] called back and said she is feeling better, she is not running a fever.  She mentioned that she put her hand on her port when she was sleeping and now it is hurting on an off.  She denies any redness, swelling or drainage to the port site.  She is going to take some Tylenol  to see if that helps. Advised her to call back if it continues to hurt.  Also asked if she would like to be seen in Symptom Management and she declined.

## 2023-12-14 NOTE — Telephone Encounter (Signed)
 Left a message and requested a return call.

## 2023-12-14 NOTE — Telephone Encounter (Signed)
 If minor URI and no fever ok to proceed. If she felt she needs to be seen, pls get [facility] involved

## 2023-12-15 ENCOUNTER — Encounter: Payer: Self-pay | Admitting: Gynecologic Oncology

## 2023-12-15 ENCOUNTER — Inpatient Hospital Stay (HOSPITAL_BASED_OUTPATIENT_CLINIC_OR_DEPARTMENT_OTHER): Admitting: Gynecologic Oncology

## 2023-12-15 ENCOUNTER — Inpatient Hospital Stay: Admitting: Gynecologic Oncology

## 2023-12-15 VITALS — BP 148/80 | HR 100 | Temp 98.1°F | Resp 19 | Wt 160.0 lb

## 2023-12-15 DIAGNOSIS — C549 Malignant neoplasm of corpus uteri, unspecified: Secondary | ICD-10-CM

## 2023-12-15 DIAGNOSIS — Z7189 Other specified counseling: Secondary | ICD-10-CM

## 2023-12-15 NOTE — Patient Instructions (Signed)
 It was great to see you!  You are healing very well from surgery.  Please remember, no heavy lifting for 6 weeks and nothing in the vagina for at least 12 weeks.  Give my office a call if you continue to have vaginal spotting.  I will see you for follow-up in 5 months.  I will be in touch once we have the final pathology report since it was sent out for expert review.

## 2023-12-15 NOTE — Progress Notes (Signed)
 Gynecologic Oncology Return Clinic Visit  12/15/23  Reason for Visit: treatment planning  Treatment History: Oncology History  Sarcoma of uterus (HCC)  07/14/2023 Imaging   1. Large pelvic mass measuring 14.7 x 14.7 x 12.0 cm. This is likely a degenerated/liquid fundal fibroid. On the prior CT scan (2018) it measured 8.6 x 8.0 x 7.8 cm. 2. Multiple hepatic cysts showing interval enlargement since the prior CT scan. 3. No acute abdominal/pelvic findings or adenopathy.   10/24/2023 Imaging   1. No acute findings.  No significant pulmonary lesion. 2. Aberrant right subclavian artery, anatomic variant.   11/02/2023 Initial Diagnosis   Malignant neoplasm of uterus (HCC)   11/02/2023 Pathology Results   SURGICAL PATHOLOGY  CASE: [id number]  PATIENT: AVELINA LESCH  Surgical Pathology Report   Clinical History: Uterine mass (las)   FINAL MICROSCOPIC DIAGNOSIS:  A. UTERUS, CERVIX, FALLOPIAN TUBE, OVARY, BILATERAL, HYSTERECTOMY:  - High-grade sarcoma involving the uterus, see comment   B. CECAL MESENTERY IMPLANT, BIOPSY:  -  High-grade sarcoma, see comment   C. OMENTAL NODULE, BIOPSY:  - Portion of omentum with mild fibrosis, negative for malignancy   D. OMENTUM:  - High-grade sarcoma, see comment   Multiple immunohistochemical stains are performed for characterization of the malignant lesions but are noncontributory.  The case will be sent out to [facility], [place] for a consult and an addendum will follow.     11/02/2023 Surgery   Pre-operative Diagnosis: Uterine mass   Post-operative Diagnosis: same, at least stage III uterine cancer   Operation: Robotic-assisted laparoscopic total hysterectomy with bilateral salpingo-oophorectomy (uterus > 250 gms), laparotomy for specimen removal, resection of omentum adherent to the cecum   Surgeon: Viktoria Crank MD     Operative Findings:  On EUA, enlarged mobile uterus.  On intra-abdominal examination, normal upper abdominal survey.   Large, approximately 15-16 cm mass arising from the uterine fundus.  Lower uterine segment itself is normal in appearance.  Significant enlargement of ovarian vasculature as well as enlarged uterine arteries.  Normal-appearing bilateral tubes and ovaries.  No ascites.  No obvious pelvic adenopathy.  Only after a portion of the hysterectomy had been completed was not apparent that there was some tumor perforating through a small, less than 1 cm area at the superior right aspect of the uterine mass with adjacent tumor involving omentum that was adherent to the cecum.  Otherwise, omentum with several small areas of thickened tissue, no visible abnormalities.  Small bowel run from the cecum to the ligament of Treitz with no significant findings.  No gross disease at the end of surgery. 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     11/25/2023 Cancer Staging   Staging form: Corpus Uteri - Leiomyosarcoma and Endometrial Stromal Sarcoma, AJCC 8th Edition - Pathologic stage from 11/25/2023: FIGO Stage IIIB (pT3b, pN0, cM0) - Signed by Lonn Hicks, MD on 11/25/2023 Stage prefix: Initial diagnosis   11/30/2023 Imaging   CT CHEST ABDOMEN PELVIS W CONTRAST Result Date: 12/07/2023 EXAM:  CT CHEST ABDOMEN PELVIS WITH IV CONTRAST INDICATION:  staging uterine cancer, high grade sarcoma TECHNIQUE: Spiral CT scanning was performed through the chest, abdomen and pelvis after the patient received oral and IV contrast. COMPARISON: 10/24/2023, 07/14/2023 FINDINGS: The cardiac size is within normal limits. There is no thoracic aortic aneurysm. No filling defects are identified in the central pulmonary arteries. The esophagus and thyroid  glands have a normal appearance. There is no mass  or adenopathy in the chest. No pleural or pericardial effusion is present. Mild linear scarring is present in the left lower lobe. The lungs are otherwise clear. There is no  suspicious pulmonary mass. Multiple hepatic cysts are present. These are unchanged and do not require imaging follow-up. There is no significant abnormality identified in the spleen, pancreas and gallbladder. No calculus, obstruction, or soft tissue mass is present involving the kidneys. There are no adrenal masses. The abdominal bowel loops are unremarkable except for mild stool retention. There is no evidence of ascites or adenopathy. No abdominal aortic aneurysm is present. The patient has had interval hysterectomy with removal of the large pelvic mass. The bladder has a normal appearance. There is no recurrent mass identified. There is no inguinal hernia. The pelvic bowel loops have a normal appearance. There is no fracture or bone destruction. IMPRESSION: No evidence of acute process. No tumor recurrence or metastatic disease is identified. Please note that CT scanning at this site utilizes multiple dose reduction techniques, including automatic exposure control, adjustment of the MAA and/or KVP according to the patient's size, and use of iterative reconstruction. Electronically signed by: Eddy Oar MD 12/07/2023 10:31 AM EDT RP Workstation: [id number]   IR IMAGING GUIDED PORT INSERTION Result Date: 12/02/2023 CLINICAL DATA:  Endometrial carcinoma and need for porta cath for chemotherapy. EXAM: IMPLANTED PORT A CATH PLACEMENT WITH ULTRASOUND AND FLUOROSCOPIC GUIDANCE ANESTHESIA/SEDATION: Moderate (conscious) sedation was employed during this procedure. A total of Versed  2.0 mg and Fentanyl  100 mcg was administered intravenously. Moderate Sedation Time: 42 minutes. The patient's level of consciousness and vital signs were monitored continuously by radiology nursing throughout the procedure under my direct supervision. FLUOROSCOPY: Radiation Exposure Index: 2.0 mGy Kerma PROCEDURE: The procedure, risks, benefits, and alternatives were explained to the patient. Questions regarding the procedure were  encouraged and answered. The patient understands and consents to the procedure. A time-out was performed prior to initiating the procedure. Ultrasound was utilized to confirm patency of the right internal jugular vein. An ultrasound image was saved and recorded. The right neck and chest were prepped with chlorhexidine  in a sterile fashion, and a sterile drape was applied covering the operative field. Maximum barrier sterile technique with sterile gowns and gloves were used for the procedure. Local anesthesia was provided with 1% lidocaine . After creating a small venotomy incision, a 21 gauge needle was advanced into the right internal jugular vein under direct, real-time ultrasound guidance. Ultrasound image documentation was performed. After securing guidewire access, an 8 Fr dilator was placed. A J-wire was kinked to measure appropriate catheter length. A subcutaneous port pocket was then created along the upper chest wall utilizing sharp and blunt dissection. Portable cautery was utilized. The pocket was irrigated with sterile saline. A single lumen power injectable port was chosen for placement. The 8 Fr catheter was tunneled from the port pocket site to the venotomy incision. The port was placed in the pocket. External catheter was trimmed to appropriate length based on guidewire measurement. At the venotomy, an 8 Fr peel-away sheath was placed over a guidewire. The catheter was then placed through the sheath and the sheath removed. Final catheter positioning was confirmed and documented with a fluoroscopic spot image. The port was accessed with a needle and aspirated and flushed with heparinized saline. The access needle was removed. The venotomy and port pocket incisions were closed with subcutaneous 3-0 Monocryl and subcuticular 4-0 Vicryl. Dermabond was applied to both incisions. COMPLICATIONS: COMPLICATIONS None FINDINGS: After catheter placement, the  tip lies at the cavo-atrial junction. The catheter  aspirates normally and is ready for immediate use. IMPRESSION: Placement of single lumen port a cath via right internal jugular vein. The catheter tip lies at the cavo-atrial junction. A power injectable port a cath was placed and is ready for immediate use. Electronically Signed   By: Marcey Moan M.D.   On: 12/02/2023 13:51      12/14/2023 Echocardiogram   1. Left ventricular ejection fraction, by estimation, is 60 to 65%. Left ventricular ejection fraction by 3D volume is 58 %. The left ventricle has normal function. The left ventricle has no regional wall motion abnormalities. Left ventricular diastolic parameters were normal.  2. Right ventricular systolic function is normal. The right ventricular size is normal. Tricuspid regurgitation signal is inadequate for assessing PA pressure.  3. The mitral valve is normal in structure. Trivial mitral valve regurgitation. No evidence of mitral stenosis.  4. The aortic valve is tricuspid. Aortic valve regurgitation is not visualized. No aortic stenosis is present.  5. The inferior vena cava is normal in size with greater than 50% respiratory variability, suggesting right atrial pressure of 3 mmHg.   12/16/2023 -  Chemotherapy   Patient is on Treatment Plan : SARCOMA Doxorubicin  (60) q21d       Interval History: Doing well.  Denies any significant abdominal or pelvic pain.  Reports intermittent vaginal spotting, which she describes as seeing some blood on the toilet paper when she wipes after voiding.  Endorses good bowel function with the use of Senokot and MiraLAX  as needed.  Endorses some nasal drip and cough since Sunday.  Denies any fevers or systemic symptoms.  Past Medical/Surgical History: Past Medical History:  Diagnosis Date   Anemia    Arthritis    knee, back; improved with weight loss   Asthma    Dry eyes    GERD (gastroesophageal reflux disease)    Hyperlipidemia    Uterine fibroid     Past Surgical History:  Procedure  Laterality Date   COLONOSCOPY     DEBULKING N/A 11/02/2023   Procedure: DEBULKING, ABDOMINAL;  Surgeon: Viktoria Comer SAUNDERS, MD;  Location: [facility];  Service: Gynecology;  Laterality: N/A;  POSSIBLE DEBULKING   DILATATION & CURETTAGE/HYSTEROSCOPY WITH MYOSURE N/A 04/29/2016   Procedure: DILATATION & CURETTAGE/HYSTEROSCOPY WITH MYOSURE;  Surgeon: Dickie Carder, MD;  Location: [facility];  Service: Gynecology;  Laterality: N/A;   IR IMAGING GUIDED PORT INSERTION  12/02/2023   LAPAROSCOPY N/A 11/02/2023   Procedure: LAPAROSCOPY, DIAGNOSTIC;  Surgeon: Viktoria Comer SAUNDERS, MD;  Location: [facility];  Service: Gynecology;  Laterality: N/A;   LAPAROTOMY N/A 11/02/2023   Procedure: MINI LAPAROTOMY;  Surgeon: Viktoria Comer SAUNDERS, MD;  Location: [facility];  Service: Gynecology;  Laterality: N/A;   ROBOTIC ASSISTED TOTAL HYSTERECTOMY WITH BILATERAL SALPINGO OOPHERECTOMY N/A 11/02/2023   Procedure: HYSTERECTOMY, TOTAL, ROBOT-ASSISTED, LAPAROSCOPIC, WITH BILATERAL SALPINGO-OOPHORECTOMY;  Surgeon: Viktoria Comer SAUNDERS, MD;  Location: [facility];  Service: Gynecology;  Laterality: N/A;   TUBAL LIGATION     UPPER GI ENDOSCOPY      Family History  Problem Relation Age of Onset   Breast cancer Neg Hx    Colon cancer Neg Hx    Pancreatic cancer Neg Hx    Prostate cancer Neg Hx    Ovarian cancer Neg Hx    Endometrial cancer Neg Hx     Social History   Socioeconomic History   Marital status: Married    Spouse name: Not on  file   Number of children: Not on file   Years of education: Not on file   Highest education level: Not on file  Occupational History   Not on file  Tobacco Use   Smoking status: Never    Passive exposure: Never   Smokeless tobacco: Never  Vaping Use   Vaping status: Never Used  Substance and Sexual Activity   Alcohol  use: No   Drug use: No   Sexual activity: Not on file  Other Topics Concern   Not on file  Social History Narrative   Not on file   Social Drivers of Health   Financial  Resource Strain: Low Risk  (10/17/2023)   [facility]   Overall Financial Resource Strain (CARDIA)    Difficulty of Paying Living Expenses: Not hard at all  Food Insecurity: No Food Insecurity (11/02/2023)   Hunger Vital Sign    Worried About Running Out of Food in the Last Year: Never true    Ran Out of Food in the Last Year: Never true  Transportation Needs: No Transportation Needs (11/02/2023)   PRAPARE - Administrator, Civil Service (Medical): No    Lack of Transportation (Non-Medical): No  Physical Activity: Not on file  Stress: Not on file  Social Connections: Not on file    Current Medications:  Current Outpatient Medications:    albuterol  (PROVENTIL ) (2.5 MG/3ML) 0.083% nebulizer solution, Take 2.5 mg by nebulization every 6 (six) hours as needed for wheezing., Disp: , Rfl:    albuterol  (VENTOLIN  HFA) 108 (90 Base) MCG/ACT inhaler, Inhale 1-2 puffs into the lungs every 6 (six) hours as needed for wheezing or shortness of breath., Disp: 18 g, Rfl: 0   apixaban  (ELIQUIS ) 2.5 MG TABS tablet, Take 1 tablet (2.5 mg total) by mouth 2 (two) times daily for 11 days., Disp: 22 tablet, Rfl: 0   atorvastatin  (LIPITOR) 20 MG tablet, Take 20 mg by mouth at bedtime., Disp: , Rfl:    ferrous sulfate  325 (65 FE) MG tablet, Take 1 tablet (325 mg total) by mouth daily., Disp: 30 tablet, Rfl: 0   lidocaine -prilocaine  (EMLA ) cream, Apply to affected area once, Disp: 30 g, Rfl: 3   Lifitegrast  (XIIDRA ) 5 % SOLN, Place 1 drop into both eyes in the morning., Disp: , Rfl:    Multiple Vitamin (MULTIVITAMIN WITH MINERALS) TABS tablet, Take 1 tablet by mouth daily., Disp: , Rfl:    ondansetron  (ZOFRAN ) 8 MG tablet, Take 1 tablet (8 mg total) by mouth every 8 (eight) hours as needed for nausea or vomiting. Start on the third day after chemotherapy., Disp: 30 tablet, Rfl: 1   Polyethyl Glycol-Propyl Glycol (SYSTANE OP), Place 1 drop into both eyes 3 (three) times daily as needed  (dry/irritated eyes.)., Disp: , Rfl:    polyethylene glycol (MIRALAX  / GLYCOLAX ) 17 g packet, Take 17 g by mouth daily., Disp: 14 each, Rfl: 0   prochlorperazine  (COMPAZINE ) 10 MG tablet, Take 1 tablet (10 mg total) by mouth every 6 (six) hours as needed for nausea or vomiting., Disp: 30 tablet, Rfl: 1   senna-docusate (SENOKOT-S) 8.6-50 MG tablet, Take 2 tablets by mouth at bedtime. For AFTER surgery, do not take if having diarrhea, Disp: 30 tablet, Rfl: 0   traMADol  (ULTRAM ) 50 MG tablet, Take 1 tablet (50 mg total) by mouth every 6 (six) hours as needed for severe pain (pain score 7-10). For AFTER surgery only, do not take and drive, Disp: 10 tablet,  Rfl: 0  Review of Systems: + Back pain, hot flashes Denies appetite changes, fevers, chills, fatigue, unexplained weight changes. Denies hearing loss, neck lumps or masses, mouth sores, ringing in ears or voice changes. Denies cough or wheezing.  Denies shortness of breath. Denies chest pain or palpitations. Denies leg swelling. Denies abdominal distention, pain, blood in stools, constipation, diarrhea, nausea, vomiting, or early satiety. Denies pain with intercourse, dysuria, frequency, hematuria or incontinence. Denies pelvic pain, vaginal bleeding or vaginal discharge.   Denies joint pain or muscle pain/cramps. Denies itching, rash, or wounds. Denies dizziness, headaches, numbness or seizures. Denies swollen lymph nodes or glands, denies easy bruising or bleeding. Denies anxiety, depression, confusion, or decreased concentration.  Physical Exam: BP (!) 148/80 Comment: manual recheck, no S&S, will monitor and f/u with PCP. MD notified  Pulse 100   Temp 98.1 F (36.7 C) (Oral)   Resp 19   Wt 160 lb (72.6 kg)   SpO2 100%   BMI 27.46 kg/m  General: Alert, oriented, no acute distress. HEENT: Posterior oropharynx clear, sclera anicteric. Chest: Unlabored breathing on room air. Abdomen: soft, nontender.  Normoactive bowel sounds.  No  masses or hepatosplenomegaly appreciated.  Well-healed incisions. Extremities: Grossly normal range of motion.  Warm, well perfused.  No edema bilaterally. GU: Normal appearing external genitalia without erythema, excoriation, or lesions.  Speculum exam reveals cuff intact, suture visible.  Couple small areas where there is petechia.  Although no active bleeding, there is small amount of old appearing blood within the top of the vagina.  The visualized areas of petechia were treated with several nitrate.  Bimanual exam reveals cuff intact, no fluctuance or tenderness to palpation.    Laboratory & Radiologic Studies: CT C/A/P on 12/01/23: No evidence of acute process. No tumor recurrence or metastatic disease is identified.  A. UTERUS, CERVIX, FALLOPIAN TUBE, OVARY, BILATERAL, HYSTERECTOMY:  - High-grade sarcoma involving the uterus, see comment   B. CECAL MESENTERY IMPLANT, BIOPSY:  -  High-grade sarcoma, see comment   C. OMENTAL NODULE, BIOPSY:  - Portion of omentum with mild fibrosis, negative for malignancy   D. OMENTUM:  - High-grade sarcoma, see comment   COMMENT:   Multiple immunohistochemical stains are performed for characterization  of the malignant lesions but are noncontributory.  The case will be sent  out to [facility], [place] for a consult and an addendum will follow.   Assessment & Plan: GRACELYNNE BENEDICT is a 58 y.o. 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  Patient is overall doing very well postoperatively.  Discussed continued expectations and restrictions.  Several small areas treated with silver nitrate given ongoing intermittent vaginal bleeding.  Reviewed pathology from surgery.  We are still awaiting expert pathology review at [facility].  Given desire not to delay treatment, plan is for cycle 1 of doxorubicin  tomorrow.  Patient understands that once we get final results, we may change treatment.  Recent CT scan after surgery was negative for  metastatic disease.  I will tentatively plan to see the patient back in 5 months.  24 minutes of total time was spent for this patient encounter, including preparation, face-to-face counseling with the patient and coordination of care, and documentation of the encounter.  Comer Dollar, MD  Division of Gynecologic Oncology  Department of Obstetrics and Gynecology  [facility]

## 2023-12-16 ENCOUNTER — Telehealth: Payer: Self-pay

## 2023-12-16 ENCOUNTER — Other Ambulatory Visit: Payer: Self-pay | Admitting: Hematology and Oncology

## 2023-12-16 ENCOUNTER — Inpatient Hospital Stay

## 2023-12-16 ENCOUNTER — Inpatient Hospital Stay: Admitting: Licensed Clinical Social Worker

## 2023-12-16 ENCOUNTER — Encounter: Payer: Self-pay | Admitting: Hematology and Oncology

## 2023-12-16 ENCOUNTER — Other Ambulatory Visit: Payer: Self-pay

## 2023-12-16 VITALS — BP 164/89 | HR 107 | Temp 97.9°F | Resp 20

## 2023-12-16 DIAGNOSIS — Z90722 Acquired absence of ovaries, bilateral: Secondary | ICD-10-CM | POA: Diagnosis not present

## 2023-12-16 DIAGNOSIS — C541 Malignant neoplasm of endometrium: Secondary | ICD-10-CM | POA: Diagnosis not present

## 2023-12-16 DIAGNOSIS — C549 Malignant neoplasm of corpus uteri, unspecified: Secondary | ICD-10-CM

## 2023-12-16 DIAGNOSIS — D509 Iron deficiency anemia, unspecified: Secondary | ICD-10-CM | POA: Diagnosis not present

## 2023-12-16 DIAGNOSIS — Z7901 Long term (current) use of anticoagulants: Secondary | ICD-10-CM | POA: Diagnosis not present

## 2023-12-16 DIAGNOSIS — Z90710 Acquired absence of both cervix and uterus: Secondary | ICD-10-CM | POA: Diagnosis not present

## 2023-12-16 DIAGNOSIS — Z79899 Other long term (current) drug therapy: Secondary | ICD-10-CM | POA: Diagnosis not present

## 2023-12-16 DIAGNOSIS — C55 Malignant neoplasm of uterus, part unspecified: Secondary | ICD-10-CM

## 2023-12-16 DIAGNOSIS — Z5111 Encounter for antineoplastic chemotherapy: Secondary | ICD-10-CM | POA: Diagnosis not present

## 2023-12-16 LAB — CMP (CANCER CENTER ONLY)
ALT: 34 U/L (ref 0–44)
AST: 23 U/L (ref 15–41)
Albumin: 4.4 g/dL (ref 3.5–5.0)
Alkaline Phosphatase: 121 U/L (ref 38–126)
Anion gap: 9 (ref 5–15)
BUN: 9 mg/dL (ref 6–20)
CO2: 27 mmol/L (ref 22–32)
Calcium: 9.7 mg/dL (ref 8.9–10.3)
Chloride: 104 mmol/L (ref 98–111)
Creatinine: 0.64 mg/dL (ref 0.44–1.00)
GFR, Estimated: >60 mL/min (ref >60)
Glucose, Bld: 127 mg/dL — ABNORMAL HIGH (ref 70–99)
Potassium: 3.6 mmol/L (ref 3.5–5.1)
Sodium: 140 mmol/L (ref 135–145)
Total Bilirubin: 0.4 mg/dL (ref 0.0–1.2)
Total Protein: 7.9 g/dL (ref 6.5–8.1)

## 2023-12-16 LAB — CBC WITH DIFFERENTIAL (CANCER CENTER ONLY)
Abs Immature Granulocytes: 0 K/uL (ref 0.00–0.07)
Basophils Absolute: 0 K/uL (ref 0.0–0.1)
Basophils Relative: 0 %
Eosinophils Absolute: 0.1 K/uL (ref 0.0–0.5)
Eosinophils Relative: 2 %
HCT: 42.5 % (ref 36.0–46.0)
Hemoglobin: 13.8 g/dL (ref 12.0–15.0)
Immature Granulocytes: 0 %
Lymphocytes Relative: 40 %
Lymphs Abs: 2.5 K/uL (ref 0.7–4.0)
MCH: 26.1 pg (ref 26.0–34.0)
MCHC: 32.5 g/dL (ref 30.0–36.0)
MCV: 80.5 fL (ref 80.0–100.0)
Monocytes Absolute: 0.4 K/uL (ref 0.1–1.0)
Monocytes Relative: 6 %
Neutro Abs: 3.2 K/uL (ref 1.7–7.7)
Neutrophils Relative %: 52 %
Platelet Count: 209 K/uL (ref 150–400)
RBC: 5.28 MIL/uL — ABNORMAL HIGH (ref 3.87–5.11)
RDW: 24.9 % — ABNORMAL HIGH (ref 11.5–15.5)
WBC Count: 6.3 K/uL (ref 4.0–10.5)
nRBC: 0 % (ref 0.0–0.2)

## 2023-12-16 LAB — IRON AND IRON BINDING CAPACITY (CC-WL,HP ONLY)
Iron: 66 ug/dL (ref 28–170)
Saturation Ratios: 24 % (ref 10.4–31.8)
TIBC: 274 ug/dL (ref 250–450)
UIBC: 208 ug/dL (ref 148–442)

## 2023-12-16 LAB — FERRITIN: Ferritin: 640 ng/mL — ABNORMAL HIGH (ref 11–307)

## 2023-12-16 MED ORDER — SODIUM CHLORIDE 0.9% FLUSH
10.0000 mL | Freq: Once | INTRAVENOUS | Status: AC
  Administered 2023-12-16: 10 mL

## 2023-12-16 MED ORDER — PALONOSETRON HCL INJECTION 0.25 MG/5ML
0.2500 mg | Freq: Once | INTRAVENOUS | Status: AC
  Administered 2023-12-16: 0.25 mg via INTRAVENOUS
  Filled 2023-12-16: qty 5

## 2023-12-16 MED ORDER — DOXORUBICIN HCL CHEMO IV INJECTION 2 MG/ML
60.0000 mg/m2 | Freq: Once | INTRAVENOUS | Status: AC
  Administered 2023-12-16: 108 mg via INTRAVENOUS
  Filled 2023-12-16: qty 54

## 2023-12-16 MED ORDER — DEXAMETHASONE SODIUM PHOSPHATE 10 MG/ML IJ SOLN
10.0000 mg | Freq: Once | INTRAMUSCULAR | Status: AC
  Administered 2023-12-16: 10 mg via INTRAVENOUS
  Filled 2023-12-16: qty 1

## 2023-12-16 MED ORDER — SODIUM CHLORIDE 0.9 % IV SOLN
150.0000 mg | Freq: Once | INTRAVENOUS | Status: AC
  Administered 2023-12-16: 150 mg via INTRAVENOUS
  Filled 2023-12-16: qty 150

## 2023-12-16 MED ORDER — SODIUM CHLORIDE 0.9 % IV SOLN: INTRAVENOUS | Status: DC

## 2023-12-16 NOTE — Progress Notes (Signed)
 [facility] Clinical Social Work  Clinical Social Work was referred by new patient protocol for assessment of psychosocial needs.  Clinical Social Worker met with patient to offer support and assess for needs during 1st infusion. Pt's husband present at her side. Pt's son was with her earlier in the day.   Pt reports to be doing well overall. She has family support and a strong faith in [place] which is helping her cope. She is maintaining a positive mindset and counting down to the end of treatment.  Interventions: CSW informed patient of the support team and support services at [facility].   CSW provided contact information and encouraged patient to call with any questions or concerns.       Follow Up Plan:  Patient will contact CSW with any support or resource needs    Kristin Hilgert E Irving Lubbers, LCSW  Clinical Social Worker [facility]

## 2023-12-16 NOTE — Patient Instructions (Signed)
 [facility] - A DEPT OF [facility]  Discharge Instructions: Thank you for choosing [facility] to provide your oncology and hematology care.   If you have a lab appointment with the Cancer Center, please go directly to the Cancer Center and check in at the registration area.   Wear comfortable clothing and clothing appropriate for easy access to any Portacath or PICC line.   We strive to give you quality time with your provider. You may need to reschedule your appointment if you arrive late (15 or more minutes).  Arriving late affects you and other patients whose appointments are after yours.  Also, if you miss three or more appointments without notifying the office, you may be dismissed from the clinic at the provider's discretion.      For prescription refill requests, have your pharmacy contact our office and allow 72 hours for refills to be completed.    Today you received the following chemotherapy and/or immunotherapy agents: Doxorubicin.       To help prevent nausea and vomiting after your treatment, we encourage you to take your nausea medication as directed.  BELOW ARE SYMPTOMS THAT SHOULD BE REPORTED IMMEDIATELY: *FEVER GREATER THAN 100.4 F (38 C) OR HIGHER *CHILLS OR SWEATING *NAUSEA AND VOMITING THAT IS NOT CONTROLLED WITH YOUR NAUSEA MEDICATION *UNUSUAL SHORTNESS OF BREATH *UNUSUAL BRUISING OR BLEEDING *URINARY PROBLEMS (pain or burning when urinating, or frequent urination) *BOWEL PROBLEMS (unusual diarrhea, constipation, pain near the anus) TENDERNESS IN MOUTH AND THROAT WITH OR WITHOUT PRESENCE OF ULCERS (sore throat, sores in mouth, or a toothache) UNUSUAL RASH, SWELLING OR PAIN  UNUSUAL VAGINAL DISCHARGE OR ITCHING   Items with * indicate a potential emergency and should be followed up as soon as possible or go to the Emergency Department if any problems should occur.  Please show the CHEMOTHERAPY ALERT CARD or  IMMUNOTHERAPY ALERT CARD at check-in to the Emergency Department and triage nurse.  Should you have questions after your visit or need to cancel or reschedule your appointment, please contact [facility] - A DEPT OF [facility]  Dept: [phone]  and follow the prompts.  Office hours are 8:00 a.m. to 4:30 p.m. Monday - Friday. Please note that voicemails left after 4:00 p.m. may not be returned until the following business day.  We are closed weekends and major holidays. You have access to a nurse at all times for urgent questions. Please call the main number to the clinic Dept: [phone] and follow the prompts.   For any non-urgent questions, you may also contact your provider using MyChart. We now offer e-Visits for anyone 74 and older to request care online for non-urgent symptoms. For details visit [email or web address].   Also download the MyChart app! Go to the app store, search "MyChart", open the app, select [place], and log in with your MyChart username and password.

## 2023-12-16 NOTE — Telephone Encounter (Signed)
 [id number]- Effectiveness of Out-of Pocket Cost Communication and Financial Navigation (CostCOM) in Cancer Patients:    Left message on voicemail following up on study interest and previous email with study documents attached for patient review. Patient has contact information if she has any questions. Will continue to follow patient about study interest.  Laury Quale, MPH  Clinical Research Coordinator

## 2023-12-16 NOTE — Research (Signed)
 [id number]- Effectiveness of Out-of Pocket Cost Communication and Financial Navigation (CostCOM) in Cancer Patients:    Spoke to patient in person during her infusion appointment to follow up on study interest. Patient confirmed receipt of email with documents. Patient has contact information if she has any questions. Will follow up about study interest once she is able to review study documents to determine study interest.  Laury Quale, MPH  Clinical Research Coordinator

## 2023-12-19 ENCOUNTER — Telehealth: Payer: Self-pay

## 2023-12-19 NOTE — Telephone Encounter (Signed)
 Kristin [name] states that she is doing fine. She is eating, drinking, and urinating well. She knows to call the office at [phone] if  she has any questions or concerns.

## 2023-12-19 NOTE — Telephone Encounter (Signed)
-----   Message from Nurse Bernardino DEL sent at 12/16/2023  4:08 PM EDT ----- Regarding: 1st Time doxorubicin  follow up 1st Time Doxorubicin  Dr Lonn No issues with treatment.

## 2023-12-20 ENCOUNTER — Encounter (HOSPITAL_COMMUNITY): Payer: Self-pay

## 2023-12-20 ENCOUNTER — Telehealth: Payer: Self-pay

## 2023-12-20 DIAGNOSIS — C549 Malignant neoplasm of corpus uteri, unspecified: Secondary | ICD-10-CM

## 2023-12-20 NOTE — Telephone Encounter (Signed)
 [id number]- Effectiveness of Out-of Pocket Cost Communication and Financial Navigation (CostCOM) in Cancer Patients:    Reached out to patient via phone to check in about study interest. Patient asked to have email with study documents resent. Resent patient study documents via email for her review and reminded patient this is a voluntary financial navigation study randomized to one of two groups and other key aspects of the study.  Patient has contact information if she has any questions.  Laury Quale, MPH  Clinical Research Coordinator

## 2023-12-21 ENCOUNTER — Telehealth: Payer: Self-pay | Admitting: *Deleted

## 2023-12-21 NOTE — Telephone Encounter (Signed)
 Pt called and left a vm stating that her right leg was on pain and wanted to know if that was normal after treatment. Called pt to f/u and left message to call office to f/u.

## 2023-12-22 ENCOUNTER — Encounter: Payer: Self-pay | Admitting: Oncology

## 2023-12-22 ENCOUNTER — Telehealth: Payer: Self-pay | Admitting: *Deleted

## 2023-12-22 ENCOUNTER — Encounter: Payer: Self-pay | Admitting: Hematology and Oncology

## 2023-12-22 NOTE — Progress Notes (Signed)
 Sent urgent referral via fax to the sarcoma clinic at [facility] per Dr. Viktoria.

## 2023-12-22 NOTE — Telephone Encounter (Signed)
 LMOM for Darice at [facility] to call the office back. Patient needs a [facility] MRN number

## 2023-12-23 ENCOUNTER — Encounter: Payer: Self-pay | Admitting: Hematology and Oncology

## 2023-12-23 NOTE — Telephone Encounter (Signed)
 [facility] MRN #[id number]

## 2023-12-26 ENCOUNTER — Telehealth: Payer: Self-pay

## 2023-12-26 DIAGNOSIS — C549 Malignant neoplasm of corpus uteri, unspecified: Secondary | ICD-10-CM

## 2023-12-26 NOTE — Progress Notes (Signed)
 Pathology from recent hysterectomy sent to [facility] for second opinion. 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

## 2023-12-26 NOTE — Telephone Encounter (Signed)
 Copied from CRM #[id number]. Topic: Care Management - Discuss Care Plan >> Dec 26, 2023 10:37 AM [name] wrote:   [name] w/[facility] contacted the Communication Center requesting to speak with the care team of Kristin [name] to discuss:  [name] called stating she will be faxing path report to [phone] and sending slides to clinic as well.   Please contact [name]  at [phone].  Thank you,  Jackson DELENA Aid [facility] Cancer Communication Center  [phone]

## 2023-12-26 NOTE — Telephone Encounter (Signed)
 [id number]- Effectiveness of Out-of Pocket Cost Communication and Financial Navigation (CostCOM) in Cancer Patients:    Reached out to patient left voicemail message checking in about study interest. Patient has contact information if she has any questions. Will continue to follow about interest in participating in this financial navigation study.  Laury Quale, MPH  Clinical Research Coordinator

## 2023-12-27 ENCOUNTER — Telehealth: Payer: Self-pay | Admitting: Oncology

## 2023-12-27 NOTE — Telephone Encounter (Signed)
 Christina called and asked about the referral to the [facility] sarcoma clinic.  Advised Dr. Viktoria has referred her because the second opinion came back from the [facility] and showed a Dedifferentiated uterine LMS.  Advised I will see if Dr. Viktoria can call her to discuss further.

## 2023-12-28 ENCOUNTER — Telehealth: Payer: Self-pay | Admitting: Oncology

## 2023-12-28 NOTE — Telephone Encounter (Signed)
 Called Kristin [name] and let her know that per Dr. Viktoria, she is being referred to the [facility] sarcoma clinic because she has a rare looking tumor under the microscope, we would like the super specialists in this area to review her case. If they recommend the same treatment that she is getting or a different regimen that we can do here in [place], she can continue treatment with Dr. Lonn. If they recommend something else, then we may need to transition her care to [facility] for treatment.    [name] verbalized understanding and said her appointment at [facility] is on 01/24/24.  She is wondering if she needs to have chemo on 01/06/24.  Advised she should keep the appointment on 01/06/24 and that  I will confirm with  Dr. Lonn and call her back tomorrow.

## 2023-12-29 ENCOUNTER — Encounter: Payer: Self-pay | Admitting: Hematology and Oncology

## 2023-12-29 NOTE — Telephone Encounter (Signed)
 Keep her chemo appt as scheduled pls

## 2023-12-29 NOTE — Telephone Encounter (Signed)
 Left a message that she will need to keep the appointments on 01/06/24 and to call back to confirm.

## 2024-01-03 ENCOUNTER — Telehealth: Payer: Self-pay

## 2024-01-03 DIAGNOSIS — C549 Malignant neoplasm of corpus uteri, unspecified: Secondary | ICD-10-CM

## 2024-01-03 NOTE — Telephone Encounter (Signed)
 [id number]- Effectiveness of Out-of Pocket Cost Communication and Financial Navigation (CostCOM) in Cancer Patients:    Left message on voicemail to follow up with patient on study interest. Patient has contact information if she has any questions.  Laury Quale, MPH  Clinical Research Coordinator

## 2024-01-05 MED FILL — Fosaprepitant Dimeglumine For IV Infusion 150 MG (Base Eq): INTRAVENOUS | Qty: 5 | Status: AC

## 2024-01-06 ENCOUNTER — Encounter: Payer: Self-pay | Admitting: Hematology and Oncology

## 2024-01-06 ENCOUNTER — Inpatient Hospital Stay (HOSPITAL_BASED_OUTPATIENT_CLINIC_OR_DEPARTMENT_OTHER): Admitting: Hematology and Oncology

## 2024-01-06 ENCOUNTER — Inpatient Hospital Stay: Attending: Gynecologic Oncology

## 2024-01-06 ENCOUNTER — Inpatient Hospital Stay

## 2024-01-06 VITALS — BP 150/73 | HR 106 | Temp 98.2°F | Resp 16 | Ht 64.0 in | Wt 160.2 lb

## 2024-01-06 VITALS — BP 134/79 | HR 93

## 2024-01-06 DIAGNOSIS — C55 Malignant neoplasm of uterus, part unspecified: Secondary | ICD-10-CM | POA: Insufficient documentation

## 2024-01-06 DIAGNOSIS — D509 Iron deficiency anemia, unspecified: Secondary | ICD-10-CM | POA: Diagnosis present

## 2024-01-06 DIAGNOSIS — C549 Malignant neoplasm of corpus uteri, unspecified: Secondary | ICD-10-CM

## 2024-01-06 DIAGNOSIS — R5381 Other malaise: Secondary | ICD-10-CM

## 2024-01-06 DIAGNOSIS — Z5111 Encounter for antineoplastic chemotherapy: Secondary | ICD-10-CM | POA: Diagnosis present

## 2024-01-06 LAB — CBC WITH DIFFERENTIAL (CANCER CENTER ONLY)
Abs Immature Granulocytes: 0.02 K/uL (ref 0.00–0.07)
Basophils Absolute: 0 K/uL (ref 0.0–0.1)
Basophils Relative: 1 %
Eosinophils Absolute: 0.1 K/uL (ref 0.0–0.5)
Eosinophils Relative: 1 %
HCT: 41.8 % (ref 36.0–46.0)
Hemoglobin: 13.8 g/dL (ref 12.0–15.0)
Immature Granulocytes: 0 %
Lymphocytes Relative: 54 %
Lymphs Abs: 3.1 K/uL (ref 0.7–4.0)
MCH: 27.2 pg (ref 26.0–34.0)
MCHC: 33 g/dL (ref 30.0–36.0)
MCV: 82.4 fL (ref 80.0–100.0)
Monocytes Absolute: 0.6 K/uL (ref 0.1–1.0)
Monocytes Relative: 11 %
Neutro Abs: 1.9 K/uL (ref 1.7–7.7)
Neutrophils Relative %: 33 %
Platelet Count: 371 K/uL (ref 150–400)
RBC: 5.07 MIL/uL (ref 3.87–5.11)
RDW: 22.4 % — ABNORMAL HIGH (ref 11.5–15.5)
WBC Count: 5.7 K/uL (ref 4.0–10.5)
nRBC: 0 % (ref 0.0–0.2)

## 2024-01-06 LAB — CMP (CANCER CENTER ONLY)
ALT: 20 U/L (ref 0–44)
AST: 18 U/L (ref 15–41)
Albumin: 4.4 g/dL (ref 3.5–5.0)
Alkaline Phosphatase: 87 U/L (ref 38–126)
Anion gap: 6 (ref 5–15)
BUN: 11 mg/dL (ref 6–20)
CO2: 29 mmol/L (ref 22–32)
Calcium: 9.7 mg/dL (ref 8.9–10.3)
Chloride: 107 mmol/L (ref 98–111)
Creatinine: 0.66 mg/dL (ref 0.44–1.00)
GFR, Estimated: >60 mL/min (ref >60)
Glucose, Bld: 107 mg/dL — ABNORMAL HIGH (ref 70–99)
Potassium: 3.5 mmol/L (ref 3.5–5.1)
Sodium: 142 mmol/L (ref 135–145)
Total Bilirubin: 0.3 mg/dL (ref 0.0–1.2)
Total Protein: 7.8 g/dL (ref 6.5–8.1)

## 2024-01-06 MED ORDER — DEXAMETHASONE SODIUM PHOSPHATE 10 MG/ML IJ SOLN
10.0000 mg | Freq: Once | INTRAMUSCULAR | Status: AC
  Administered 2024-01-06: 10 mg via INTRAVENOUS
  Filled 2024-01-06: qty 1

## 2024-01-06 MED ORDER — DOXORUBICIN HCL CHEMO IV INJECTION 2 MG/ML
60.0000 mg/m2 | Freq: Once | INTRAVENOUS | Status: AC
  Administered 2024-01-06: 108 mg via INTRAVENOUS
  Filled 2024-01-06: qty 54

## 2024-01-06 MED ORDER — PALONOSETRON HCL INJECTION 0.25 MG/5ML
0.2500 mg | Freq: Once | INTRAVENOUS | Status: AC
  Administered 2024-01-06: 0.25 mg via INTRAVENOUS
  Filled 2024-01-06: qty 5

## 2024-01-06 MED ORDER — SODIUM CHLORIDE 0.9 % IV SOLN
150.0000 mg | Freq: Once | INTRAVENOUS | Status: AC
  Administered 2024-01-06: 150 mg via INTRAVENOUS
  Filled 2024-01-06: qty 150

## 2024-01-06 MED ORDER — SODIUM CHLORIDE 0.9 % IV SOLN: INTRAVENOUS | Status: DC

## 2024-01-06 NOTE — Patient Instructions (Signed)
 [facility] - A DEPT OF [facility]  Discharge Instructions: Thank you for choosing [facility] to provide your oncology and hematology care.   If you have a lab appointment with the Cancer Center, please go directly to the Cancer Center and check in at the registration area.   Wear comfortable clothing and clothing appropriate for easy access to any Portacath or PICC line.   We strive to give you quality time with your provider. You may need to reschedule your appointment if you arrive late (15 or more minutes).  Arriving late affects you and other patients whose appointments are after yours.  Also, if you miss three or more appointments without notifying the office, you may be dismissed from the clinic at the provider's discretion.      For prescription refill requests, have your pharmacy contact our office and allow 72 hours for refills to be completed.    Today you received the following chemotherapy and/or immunotherapy agents: Doxorubicin.       To help prevent nausea and vomiting after your treatment, we encourage you to take your nausea medication as directed.  BELOW ARE SYMPTOMS THAT SHOULD BE REPORTED IMMEDIATELY: *FEVER GREATER THAN 100.4 F (38 C) OR HIGHER *CHILLS OR SWEATING *NAUSEA AND VOMITING THAT IS NOT CONTROLLED WITH YOUR NAUSEA MEDICATION *UNUSUAL SHORTNESS OF BREATH *UNUSUAL BRUISING OR BLEEDING *URINARY PROBLEMS (pain or burning when urinating, or frequent urination) *BOWEL PROBLEMS (unusual diarrhea, constipation, pain near the anus) TENDERNESS IN MOUTH AND THROAT WITH OR WITHOUT PRESENCE OF ULCERS (sore throat, sores in mouth, or a toothache) UNUSUAL RASH, SWELLING OR PAIN  UNUSUAL VAGINAL DISCHARGE OR ITCHING   Items with * indicate a potential emergency and should be followed up as soon as possible or go to the Emergency Department if any problems should occur.  Please show the CHEMOTHERAPY ALERT CARD or  IMMUNOTHERAPY ALERT CARD at check-in to the Emergency Department and triage nurse.  Should you have questions after your visit or need to cancel or reschedule your appointment, please contact [facility] - A DEPT OF [facility]  Dept: [phone]  and follow the prompts.  Office hours are 8:00 a.m. to 4:30 p.m. Monday - Friday. Please note that voicemails left after 4:00 p.m. may not be returned until the following business day.  We are closed weekends and major holidays. You have access to a nurse at all times for urgent questions. Please call the main number to the clinic Dept: [phone] and follow the prompts.   For any non-urgent questions, you may also contact your provider using MyChart. We now offer e-Visits for anyone 74 and older to request care online for non-urgent symptoms. For details visit [email or web address].   Also download the MyChart app! Go to the app store, search "MyChart", open the app, select [place], and log in with your MyChart username and password.

## 2024-01-06 NOTE — Progress Notes (Signed)
 [facility] OFFICE PROGRESS NOTE  Patient Care Team: Emilio Joesph [name] as PCP - General (Physician Assistant)  Assessment & Plan Sarcoma of uterus [facility]) The patient was diagnosed with advanced age uterine cancer with abnormal imaging in May 2025 and subsequently underwent surgery in July 2025 Pathology: High-grade sarcoma  Review of CT imaging from July 2025 which showed no evidence of residual disease She tolerated cycle 1 of treatment very well with no major side effects We will continue treatment as scheduled She has appointment to go to [facility] sarcoma clinic for second opinion Physical deconditioning She complained of intermittent generalized weakness this past month Repeat labs are completely normal I suspect she has element of deconditioning I recommend the patient to exercise as tolerated  No orders of the defined types were placed in this encounter.    Almarie Bedford, MD  INTERVAL HISTORY: she returns for treatment follow-up for cycle 2 of chemotherapy She is here accompanied by her son Complications related to previous cycle of chemotherapy included generalized weakness She denies nausea, mucositis or hand-foot syndrome  PHYSICAL EXAMINATION: ECOG PERFORMANCE STATUS: 1 - Symptomatic but completely ambulatory  No results found for: CAN125    Latest Ref Rng & Units 01/06/2024   11:43 AM 12/16/2023    1:13 PM 11/05/2023    4:33 AM  CBC  WBC 4.0 - 10.5 K/uL 5.7  6.3  8.1   Hemoglobin 12.0 - 15.0 g/dL 86.1  86.1  89.7   Hematocrit 36.0 - 46.0 % 41.8  42.5  35.0   Platelets 150 - 400 K/uL 371  209  679       Chemistry      Component Value Date/Time   NA 142 01/06/2024 1143   K 3.5 01/06/2024 1143   CL 107 01/06/2024 1143   CO2 29 01/06/2024 1143   BUN 11 01/06/2024 1143   CREATININE 0.66 01/06/2024 1143      Component Value Date/Time   CALCIUM  9.7 01/06/2024 1143   ALKPHOS 87 01/06/2024 1143   AST 18 01/06/2024 1143   ALT 20 01/06/2024 1143    BILITOT 0.3 01/06/2024 1143       Vitals:   01/06/24 1212  BP: (!) 150/73  Pulse: (!) 106  Resp: 16  Temp: 98.2 F (36.8 C)  SpO2: 99%   Filed Weights   01/06/24 1212  Weight: 160 lb 3.2 oz (72.7 kg)   Other relevant data reviewed during this visit included CBC and CMP

## 2024-01-06 NOTE — Assessment & Plan Note (Addendum)
 She complained of intermittent generalized weakness this past month Repeat labs are completely normal I suspect she has element of deconditioning I recommend the patient to exercise as tolerated

## 2024-01-06 NOTE — Research (Signed)
 [id number]- Effectiveness of Out-of Pocket Cost Communication and Financial Navigation (CostCOM) in Cancer Patients:    Followed up with patient accompanied by her son, on study interest. Patient says she has not had a chance to review consent documents and her son will be going over the documentation with her. Son says he will assist pt. Will continue to follow patient on interest in participating in this financial navigation study. Patient has contact information if she has any questions.  Laury Quale, MPH  Clinical Research Coordinator

## 2024-01-06 NOTE — Assessment & Plan Note (Addendum)
 The patient was diagnosed with advanced age uterine cancer with abnormal imaging in May 2025 and subsequently underwent surgery in July 2025 Pathology: High-grade sarcoma  Review of CT imaging from July 2025 which showed no evidence of residual disease She tolerated cycle 1 of treatment very well with no major side effects We will continue treatment as scheduled She has appointment to go to [facility] sarcoma clinic for second opinion

## 2024-01-09 LAB — SURGICAL PATHOLOGY

## 2024-01-17 ENCOUNTER — Telehealth: Payer: Self-pay | Admitting: Surgery

## 2024-01-17 NOTE — Telephone Encounter (Signed)
 Pt called in stating she needs her FMLA paperwork extended, per her job's request. Transferred patient to [facility] department for further assistance.

## 2024-01-18 ENCOUNTER — Encounter: Payer: Self-pay | Admitting: Hematology and Oncology

## 2024-01-20 ENCOUNTER — Telehealth: Payer: Self-pay

## 2024-01-20 DIAGNOSIS — C549 Malignant neoplasm of corpus uteri, unspecified: Secondary | ICD-10-CM

## 2024-01-20 NOTE — Telephone Encounter (Signed)
 [id number]- Effectiveness of Out-of Pocket Cost Communication and Financial Navigation (CostCOM) in Cancer Patients:    Followed up with patient via phone on study interest. Left voicemail message. Patient has contact information if she has any questions. Will continue to follow patient about study interest.  Laury Quale, MPH  Clinical Research Coordinator

## 2024-01-23 ENCOUNTER — Ambulatory Visit (HOSPITAL_COMMUNITY)

## 2024-01-24 DIAGNOSIS — C549 Malignant neoplasm of corpus uteri, unspecified: Secondary | ICD-10-CM | POA: Diagnosis not present

## 2024-01-24 DIAGNOSIS — C55 Malignant neoplasm of uterus, part unspecified: Secondary | ICD-10-CM | POA: Diagnosis not present

## 2024-01-24 DIAGNOSIS — C499 Malignant neoplasm of connective and soft tissue, unspecified: Secondary | ICD-10-CM | POA: Diagnosis not present

## 2024-01-26 ENCOUNTER — Encounter: Payer: Self-pay | Admitting: Hematology and Oncology

## 2024-01-26 ENCOUNTER — Telehealth: Payer: Self-pay

## 2024-01-26 ENCOUNTER — Telehealth: Payer: Self-pay | Admitting: *Deleted

## 2024-01-26 MED FILL — Fosaprepitant Dimeglumine For IV Infusion 150 MG (Base Eq): INTRAVENOUS | Qty: 5 | Status: AC

## 2024-01-26 NOTE — Telephone Encounter (Addendum)
 Kristin [name], [phone] (home) called this morning reporting here FMLA form to extend her leave is due today by 3:00 pm.  Advised form received 01/18/2024 has not yet been started.  We ask patients to allow up to 14-calendar days to process forms.   This affects my livelihood.  I will lose employment and insurance.  I will come by now.   .    Advised a signed HIPAA Authorization is required to be signed. Will call when form completed for pick up and ROI to be signed.yet says she is on her way.  [name]   [name]

## 2024-01-26 NOTE — Telephone Encounter (Signed)
 Spoke with patient in regards to [facility] paperwork.  Call transferred to [facility] department at [phone].

## 2024-01-27 ENCOUNTER — Inpatient Hospital Stay: Admitting: Hematology and Oncology

## 2024-01-27 ENCOUNTER — Inpatient Hospital Stay

## 2024-01-27 ENCOUNTER — Other Ambulatory Visit: Payer: Self-pay

## 2024-01-31 ENCOUNTER — Encounter: Payer: Self-pay | Admitting: Acute Care

## 2024-01-31 ENCOUNTER — Telehealth (INDEPENDENT_AMBULATORY_CARE_PROVIDER_SITE_OTHER): Admitting: Acute Care

## 2024-01-31 ENCOUNTER — Ambulatory Visit: Admitting: Acute Care

## 2024-01-31 DIAGNOSIS — C549 Malignant neoplasm of corpus uteri, unspecified: Secondary | ICD-10-CM

## 2024-01-31 DIAGNOSIS — R942 Abnormal results of pulmonary function studies: Secondary | ICD-10-CM

## 2024-01-31 DIAGNOSIS — R911 Solitary pulmonary nodule: Secondary | ICD-10-CM | POA: Diagnosis not present

## 2024-01-31 DIAGNOSIS — J45909 Unspecified asthma, uncomplicated: Secondary | ICD-10-CM

## 2024-01-31 DIAGNOSIS — R9389 Abnormal findings on diagnostic imaging of other specified body structures: Secondary | ICD-10-CM | POA: Diagnosis not present

## 2024-01-31 NOTE — Patient Instructions (Signed)
 It is good to see you today. I am glad you are doing so well with your chemo, and that your asthma is well controlled. I have sent a message to Dr. Lonn to see when she will be imaging for treatment response so we can add CT Chest to follow up on lung nodules. The last 2 scans were negative for nodules of concern, which is great news.  Call if needed for your asthma or any breathing issues.Kristin [name] with the rest of your treatment.  Please contact office for sooner follow up if symptoms do not improve or worsen or seek emergency care

## 2024-01-31 NOTE — Progress Notes (Signed)
 Virtual Visit via Video Note  I connected with Kristin [name] on 01/31/24 at  9:00 AM EDT by a video enabled telemedicine application and verified that I am speaking with the correct person using two identifiers.  Location: Patient:  At home Provider: [address] [place], [place], [address]    I discussed the limitations of evaluation and management by telemedicine and the availability of in person appointments. The patient expressed understanding and agreed to proceed.  History of Present Illness: Kristin [name] is a 58 y.o. female never smoker with an incidental finding of a 4 mm pulmonary nodule on CT chest in May 2925. She was referred by Dr. Comer Dollar.    In May 2025 , a chest x-ray revealed a small four millimeter nodule, prompting a follow-up CT scan. The CT scan showed no significant pulmonary lesions, pleural effusion, pneumothorax, or lymph node adenopathy. The lungs were clear. Follow up Chest imaging 10/24/2023, and 12/07/2023 show no nodules of concern.    She has a history of asthma but has not experienced recent episodes and has not used her albuterol  inhaler or breathing treatments for a long time. She uses albuterol  as needed. She denies coughing, or hemoptysis. No shortness of breath or chest pain.   She reports significant weight loss, having lost over 30 pounds since July of  2025.  She attributes the weight loss to fibroids, which are the etiology of her  anemia. She is currently on iron pills and has received iron infusions twice. Her hemoglobin was noted to be as low as 7.9 on 10/16/2023. And is now up to 13.9  Her fibroid biopsy was positive for uterine cancer 09/2023. She underwent surgery 11/2023, and biopsy was positive for High Grade Sarcoma.She is currently undergoing cycles of chemo per Dr. Lonn.She states she is doing well. No breathing issues , her asthma is well controlled. She is  not using maintenance, and rarely needs to use her albuterol . She  denies any weight loss or hemoptysis.    I have messaged Dr. Lonn, and we will try to co-ordinate her follow up imaging to include chest. If the CT Chest remains clear, we can consider spreading out follow up imaging.Pt. appears to be doing very well.   Observations/Objective: 12/01/2023 CT Chest , abdomen, pelvis No filling defects are identified in the central pulmonary arteries. The esophagus and thyroid  glands have a normal appearance. There is no mass or adenopathy in the chest. No pleural or pericardial effusion is present. Mild linear scarring is present in the left lower lobe. The lungs are otherwise clear. There is no suspicious pulmonary mass.  Assessment and Plan: Pulmonary Nodule Asthma, stable Uterine Sarcoma Undergoing chemo per Dr. Lonn Plan I am glad you are doing so well with your chemo, and that your asthma is well controlled. I have sent a message to Dr. Lonn to see when she will be imaging for treatment response so we can add CT Chest to follow up on lung nodules. We will monitor for any changes in your lungs when Dr. Lonn repeats your imaging after your treatment is complete. The last 2 scans were negative for nodules of concern, which is great news.  Call if needed for your asthma or any breathing issues.[name] Metta Sours with the rest of your treatment.  Please contact office for sooner follow up if symptoms do not improve or worsen or seek emergency care   Follow up in 3 months or sooner if needed.  Follow Up Instructions: Follow up as needed post CT Chest. Call for any unexplained weight loss or hemoptysis.   I discussed the assessment and treatment plan with the patient. The patient was provided an opportunity to ask questions and all were answered. The patient agreed with the plan and demonstrated an understanding of the instructions.   The patient was advised to call back or seek an in-person evaluation if the symptoms worsen or if the condition  fails to improve as anticipated.  I provided 59 minutes of video -face-to-face time during this encounter.   Lauraine JULIANNA Lites, NP

## 2024-02-01 ENCOUNTER — Encounter: Payer: Self-pay | Admitting: Hematology and Oncology

## 2024-02-02 ENCOUNTER — Telehealth: Payer: Self-pay

## 2024-02-02 NOTE — Telephone Encounter (Signed)
 Called and scheduled appt on 10/7. She is aware of appt date/time.

## 2024-02-07 ENCOUNTER — Encounter: Payer: Self-pay | Admitting: Hematology and Oncology

## 2024-02-07 ENCOUNTER — Inpatient Hospital Stay

## 2024-02-07 ENCOUNTER — Inpatient Hospital Stay: Attending: Gynecologic Oncology | Admitting: Hematology and Oncology

## 2024-02-07 VITALS — BP 156/73 | HR 78 | Temp 97.9°F | Resp 18 | Ht 64.0 in | Wt 166.2 lb

## 2024-02-07 DIAGNOSIS — Z5111 Encounter for antineoplastic chemotherapy: Secondary | ICD-10-CM | POA: Diagnosis not present

## 2024-02-07 DIAGNOSIS — C549 Malignant neoplasm of corpus uteri, unspecified: Secondary | ICD-10-CM

## 2024-02-07 DIAGNOSIS — C55 Malignant neoplasm of uterus, part unspecified: Secondary | ICD-10-CM | POA: Insufficient documentation

## 2024-02-07 DIAGNOSIS — Z23 Encounter for immunization: Secondary | ICD-10-CM | POA: Diagnosis not present

## 2024-02-07 DIAGNOSIS — D509 Iron deficiency anemia, unspecified: Secondary | ICD-10-CM | POA: Diagnosis not present

## 2024-02-07 LAB — CBC WITH DIFFERENTIAL (CANCER CENTER ONLY)
Abs Immature Granulocytes: 0.01 K/uL (ref 0.00–0.07)
Basophils Absolute: 0.1 K/uL (ref 0.0–0.1)
Basophils Relative: 1 %
Eosinophils Absolute: 0.3 K/uL (ref 0.0–0.5)
Eosinophils Relative: 4 %
HCT: 42.6 % (ref 36.0–46.0)
Hemoglobin: 13.9 g/dL (ref 12.0–15.0)
Immature Granulocytes: 0 %
Lymphocytes Relative: 38 %
Lymphs Abs: 2.5 K/uL (ref 0.7–4.0)
MCH: 28.1 pg (ref 26.0–34.0)
MCHC: 32.6 g/dL (ref 30.0–36.0)
MCV: 86.1 fL (ref 80.0–100.0)
Monocytes Absolute: 0.6 K/uL (ref 0.1–1.0)
Monocytes Relative: 8 %
Neutro Abs: 3.2 K/uL (ref 1.7–7.7)
Neutrophils Relative %: 49 %
Platelet Count: 276 K/uL (ref 150–400)
RBC: 4.95 MIL/uL (ref 3.87–5.11)
RDW: 19.7 % — ABNORMAL HIGH (ref 11.5–15.5)
WBC Count: 6.6 K/uL (ref 4.0–10.5)
nRBC: 0 % (ref 0.0–0.2)

## 2024-02-07 LAB — CMP (CANCER CENTER ONLY)
ALT: 25 U/L (ref 0–44)
AST: 19 U/L (ref 15–41)
Albumin: 4.5 g/dL (ref 3.5–5.0)
Alkaline Phosphatase: 90 U/L (ref 38–126)
Anion gap: 6 (ref 5–15)
BUN: 16 mg/dL (ref 6–20)
CO2: 32 mmol/L (ref 22–32)
Calcium: 10.1 mg/dL (ref 8.9–10.3)
Chloride: 105 mmol/L (ref 98–111)
Creatinine: 0.73 mg/dL (ref 0.44–1.00)
GFR, Estimated: >60 mL/min (ref >60)
Glucose, Bld: 101 mg/dL — ABNORMAL HIGH (ref 70–99)
Potassium: 3.4 mmol/L — ABNORMAL LOW (ref 3.5–5.1)
Sodium: 143 mmol/L (ref 135–145)
Total Bilirubin: 0.4 mg/dL (ref 0.0–1.2)
Total Protein: 8.2 g/dL — ABNORMAL HIGH (ref 6.5–8.1)

## 2024-02-07 NOTE — Progress Notes (Signed)
 [facility] OFFICE PROGRESS NOTE  Patient Care Team: Emilio Joesph [name] as PCP - General (Physician Assistant)  Assessment & Plan Sarcoma of uterus [facility]) The patient was diagnosed with advanced age uterine cancer with abnormal imaging in May 2025 and subsequently underwent surgery in July 2025 Pathology: High-grade sarcoma  Review of CT imaging from July 2025 which showed no evidence of residual disease She tolerated cycle 1&2 of treatment very well with no major side effects I have reviewed recommendation from [facility] We discussed risk and benefits of combination chemotherapy versus continuing single agent doxorubicin  Ultimately, the patient is in agreement to continue her current treatment I plan to repeat imaging study after completion of chemotherapy in January Her next echocardiogram will be due in November  No orders of the defined types were placed in this encounter.    Almarie Bedford, MD  INTERVAL HISTORY: she returns for treatment follow-up and to discuss about potential changes to her treatment plan I have reviewed documentation from [facility] She is here today accompanied by son and husband So far, she tolerated chemotherapy well We discussed risk and benefits of combination chemotherapy versus continuing with current treatment Complications related to previous cycle of chemotherapy included none  PHYSICAL EXAMINATION: ECOG PERFORMANCE STATUS: 0 - Asymptomatic  No results found for: CAN125    Latest Ref Rng & Units 01/06/2024   11:43 AM 12/16/2023    1:13 PM 11/05/2023    4:33 AM  CBC  WBC 4.0 - 10.5 K/uL 5.7  6.3  8.1   Hemoglobin 12.0 - 15.0 g/dL 86.1  86.1  89.7   Hematocrit 36.0 - 46.0 % 41.8  42.5  35.0   Platelets 150 - 400 K/uL 371  209  679       Chemistry      Component Value Date/Time   NA 142 01/06/2024 1143   K 3.5 01/06/2024 1143   CL 107 01/06/2024 1143   CO2 29 01/06/2024 1143   BUN 11 01/06/2024 1143   CREATININE 0.66 01/06/2024 1143       Component Value Date/Time   CALCIUM  9.7 01/06/2024 1143   ALKPHOS 87 01/06/2024 1143   AST 18 01/06/2024 1143   ALT 20 01/06/2024 1143   BILITOT 0.3 01/06/2024 1143       Vitals:   02/07/24 1146  BP: (!) 156/73  Pulse: 78  Resp: 18  Temp: 97.9 F (36.6 C)  SpO2: 100%   Filed Weights   02/07/24 1146  Weight: 166 lb 3.2 oz (75.4 kg)   Other relevant data reviewed during this visit included recommendation from [facility]

## 2024-02-07 NOTE — Assessment & Plan Note (Addendum)
 The patient was diagnosed with advanced age uterine cancer with abnormal imaging in May 2025 and subsequently underwent surgery in July 2025 Pathology: High-grade sarcoma  Review of CT imaging from July 2025 which showed no evidence of residual disease She tolerated cycle 1&2 of treatment very well with no major side effects I have reviewed recommendation from [facility] We discussed risk and benefits of combination chemotherapy versus continuing single agent doxorubicin  Ultimately, the patient is in agreement to continue her current treatment I plan to repeat imaging study after completion of chemotherapy in January Her next echocardiogram will be due in November

## 2024-02-08 ENCOUNTER — Ambulatory Visit

## 2024-02-08 VITALS — BP 147/77 | HR 95 | Temp 98.9°F | Resp 18

## 2024-02-08 DIAGNOSIS — C55 Malignant neoplasm of uterus, part unspecified: Secondary | ICD-10-CM | POA: Diagnosis not present

## 2024-02-08 DIAGNOSIS — C549 Malignant neoplasm of corpus uteri, unspecified: Secondary | ICD-10-CM

## 2024-02-08 MED ORDER — DEXAMETHASONE SODIUM PHOSPHATE 10 MG/ML IJ SOLN
10.0000 mg | Freq: Once | INTRAMUSCULAR | Status: AC
  Administered 2024-02-08: 10 mg via INTRAVENOUS
  Filled 2024-02-08: qty 1

## 2024-02-08 MED ORDER — INFLUENZA VIRUS VACC SPLIT PF (FLUZONE) 0.5 ML IM SUSY
0.5000 mL | PREFILLED_SYRINGE | Freq: Once | INTRAMUSCULAR | Status: AC
  Administered 2024-02-08: 0.5 mL via INTRAMUSCULAR
  Filled 2024-02-08: qty 0.5

## 2024-02-08 MED ORDER — DOXORUBICIN HCL CHEMO IV INJECTION 2 MG/ML
60.0000 mg/m2 | Freq: Once | INTRAVENOUS | Status: AC
  Administered 2024-02-08: 112 mg via INTRAVENOUS
  Filled 2024-02-08: qty 56

## 2024-02-08 MED ORDER — SODIUM CHLORIDE 0.9 % IV SOLN: INTRAVENOUS | Status: DC

## 2024-02-08 MED ORDER — PALONOSETRON HCL INJECTION 0.25 MG/5ML
0.2500 mg | Freq: Once | INTRAVENOUS | Status: AC
  Administered 2024-02-08: 0.25 mg via INTRAVENOUS
  Filled 2024-02-08: qty 5

## 2024-02-08 MED ORDER — SODIUM CHLORIDE 0.9 % IV SOLN
150.0000 mg | Freq: Once | INTRAVENOUS | Status: AC
  Administered 2024-02-08: 150 mg via INTRAVENOUS
  Filled 2024-02-08: qty 150

## 2024-02-08 MED ORDER — SODIUM CHLORIDE 0.9% FLUSH: 10.0000 mL | INTRAVENOUS | Status: DC | PRN

## 2024-02-08 NOTE — Patient Instructions (Signed)
 [facility] - A DEPT OF [facility]  Discharge Instructions: Thank you for choosing [facility] to provide your oncology and hematology care.   If you have a lab appointment with the Cancer Center, please go directly to the Cancer Center and check in at the registration area.   Wear comfortable clothing and clothing appropriate for easy access to any Portacath or PICC line.   We strive to give you quality time with your provider. You may need to reschedule your appointment if you arrive late (15 or more minutes).  Arriving late affects you and other patients whose appointments are after yours.  Also, if you miss three or more appointments without notifying the office, you may be dismissed from the clinic at the provider's discretion.      For prescription refill requests, have your pharmacy contact our office and allow 72 hours for refills to be completed.    Today you received the following chemotherapy and/or immunotherapy agents doxorubicin       To help prevent nausea and vomiting after your treatment, we encourage you to take your nausea medication as directed.  BELOW ARE SYMPTOMS THAT SHOULD BE REPORTED IMMEDIATELY: *FEVER GREATER THAN 100.4 F (38 C) OR HIGHER *CHILLS OR SWEATING *NAUSEA AND VOMITING THAT IS NOT CONTROLLED WITH YOUR NAUSEA MEDICATION *UNUSUAL SHORTNESS OF BREATH *UNUSUAL BRUISING OR BLEEDING *URINARY PROBLEMS (pain or burning when urinating, or frequent urination) *BOWEL PROBLEMS (unusual diarrhea, constipation, pain near the anus) TENDERNESS IN MOUTH AND THROAT WITH OR WITHOUT PRESENCE OF ULCERS (sore throat, sores in mouth, or a toothache) UNUSUAL RASH, SWELLING OR PAIN  UNUSUAL VAGINAL DISCHARGE OR ITCHING   Items with * indicate a potential emergency and should be followed up as soon as possible or go to the Emergency Department if any problems should occur.  Please show the CHEMOTHERAPY ALERT CARD or IMMUNOTHERAPY  ALERT CARD at check-in to the Emergency Department and triage nurse.  Should you have questions after your visit or need to cancel or reschedule your appointment, please contact [facility]  Dept: [phone]  and follow the prompts.  Office hours are 8:00 a.m. to 4:30 p.m. Monday - Friday. Please note that voicemails left after 4:00 p.m. may not be returned until the following business day.  We are closed weekends and major holidays. You have access to a nurse at all times for urgent questions. Please call the main number to the clinic Dept: [phone] and follow the prompts.   For any non-urgent questions, you may also contact your provider using MyChart. We now offer e-Visits for anyone 28 and older to request care online for non-urgent symptoms. For details visit [email or web address].   Also download the MyChart app! Go to the app store, search MyChart, open the app, select [place], and log in with your MyChart username and password.

## 2024-02-09 ENCOUNTER — Encounter: Payer: Self-pay | Admitting: Hematology and Oncology

## 2024-02-09 NOTE — Telephone Encounter (Signed)
 Avelina JAYSON Lesch signed and completed [place] HIPAA Authorization via DocuSign 02/06/2024 at 1908.   02/08/2024: placed form in [facility] HIM bin for items to be scanned completes this process.

## 2024-02-17 ENCOUNTER — Inpatient Hospital Stay: Admitting: Hematology and Oncology

## 2024-02-17 ENCOUNTER — Inpatient Hospital Stay

## 2024-02-29 MED FILL — Fosaprepitant Dimeglumine For IV Infusion 150 MG (Base Eq): INTRAVENOUS | Qty: 5 | Status: AC

## 2024-03-01 ENCOUNTER — Encounter: Payer: Self-pay | Admitting: Hematology and Oncology

## 2024-03-01 ENCOUNTER — Inpatient Hospital Stay: Admitting: Licensed Clinical Social Worker

## 2024-03-01 ENCOUNTER — Inpatient Hospital Stay: Admitting: Hematology and Oncology

## 2024-03-01 ENCOUNTER — Inpatient Hospital Stay

## 2024-03-01 VITALS — BP 114/69 | HR 66 | Temp 97.6°F | Resp 19 | Wt 162.8 lb

## 2024-03-01 DIAGNOSIS — D696 Thrombocytopenia, unspecified: Secondary | ICD-10-CM | POA: Insufficient documentation

## 2024-03-01 DIAGNOSIS — C549 Malignant neoplasm of corpus uteri, unspecified: Secondary | ICD-10-CM

## 2024-03-01 DIAGNOSIS — Z609 Problem related to social environment, unspecified: Secondary | ICD-10-CM | POA: Insufficient documentation

## 2024-03-01 DIAGNOSIS — C55 Malignant neoplasm of uterus, part unspecified: Secondary | ICD-10-CM | POA: Diagnosis not present

## 2024-03-01 LAB — CBC WITH DIFFERENTIAL (CANCER CENTER ONLY)
Abs Immature Granulocytes: 0.01 K/uL (ref 0.00–0.07)
Basophils Absolute: 0.1 K/uL (ref 0.0–0.1)
Basophils Relative: 1 %
Eosinophils Absolute: 0 K/uL (ref 0.0–0.5)
Eosinophils Relative: 1 %
HCT: 42 % (ref 36.0–46.0)
Hemoglobin: 14.1 g/dL (ref 12.0–15.0)
Immature Granulocytes: 0 %
Lymphocytes Relative: 42 %
Lymphs Abs: 2.1 K/uL (ref 0.7–4.0)
MCH: 29.3 pg (ref 26.0–34.0)
MCHC: 33.6 g/dL (ref 30.0–36.0)
MCV: 87.1 fL (ref 80.0–100.0)
Monocytes Absolute: 0.6 K/uL (ref 0.1–1.0)
Monocytes Relative: 12 %
Neutro Abs: 2.2 K/uL (ref 1.7–7.7)
Neutrophils Relative %: 44 %
Platelet Count: 427 K/uL — ABNORMAL HIGH (ref 150–400)
RBC: 4.82 MIL/uL (ref 3.87–5.11)
RDW: 17.2 % — ABNORMAL HIGH (ref 11.5–15.5)
WBC Count: 4.9 K/uL (ref 4.0–10.5)
nRBC: 0 % (ref 0.0–0.2)

## 2024-03-01 LAB — CMP (CANCER CENTER ONLY)
ALT: 19 U/L (ref 0–44)
AST: 18 U/L (ref 15–41)
Albumin: 4.4 g/dL (ref 3.5–5.0)
Alkaline Phosphatase: 88 U/L (ref 38–126)
Anion gap: 9 (ref 5–15)
BUN: 11 mg/dL (ref 6–20)
CO2: 27 mmol/L (ref 22–32)
Calcium: 10 mg/dL (ref 8.9–10.3)
Chloride: 105 mmol/L (ref 98–111)
Creatinine: 0.8 mg/dL (ref 0.44–1.00)
GFR, Estimated: >60 mL/min (ref >60)
Glucose, Bld: 139 mg/dL — ABNORMAL HIGH (ref 70–99)
Potassium: 3.5 mmol/L (ref 3.5–5.1)
Sodium: 141 mmol/L (ref 135–145)
Total Bilirubin: 0.3 mg/dL (ref 0.0–1.2)
Total Protein: 7.8 g/dL (ref 6.5–8.1)

## 2024-03-01 MED ORDER — SODIUM CHLORIDE 0.9 % IV SOLN
150.0000 mg | Freq: Once | INTRAVENOUS | Status: AC
  Administered 2024-03-01: 150 mg via INTRAVENOUS
  Filled 2024-03-01: qty 150

## 2024-03-01 MED ORDER — DEXAMETHASONE SOD PHOSPHATE PF 10 MG/ML IJ SOLN
10.0000 mg | Freq: Once | INTRAMUSCULAR | Status: AC
  Administered 2024-03-01: 10 mg via INTRAVENOUS

## 2024-03-01 MED ORDER — DOXORUBICIN HCL CHEMO IV INJECTION 2 MG/ML
60.0000 mg/m2 | Freq: Once | INTRAVENOUS | Status: AC
  Administered 2024-03-01: 110 mg via INTRAVENOUS
  Filled 2024-03-01: qty 55

## 2024-03-01 MED ORDER — PALONOSETRON HCL INJECTION 0.25 MG/5ML
0.2500 mg | Freq: Once | INTRAVENOUS | Status: AC
  Administered 2024-03-01: 0.25 mg via INTRAVENOUS
  Filled 2024-03-01: qty 5

## 2024-03-01 MED ORDER — SODIUM CHLORIDE 0.9 % IV SOLN: INTRAVENOUS | Status: DC

## 2024-03-01 NOTE — Assessment & Plan Note (Addendum)
 She is concerned about losing her insurance coverage due to her work situation I will consult social worker to follow

## 2024-03-01 NOTE — Progress Notes (Signed)
 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 The patient denies recent history of bleeding such as epistaxis, hematuria or hematochezia. She is asymptomatic from the low platelet count. I will observe for now.  she does not require transfusion now. I will continue the chemotherapy at current dose without dosage adjustment.  If the thrombocytopenia gets progressive worse in the future, I might have to delay her treatment or adjust the chemotherapy dose.  Poor social situation She is concerned about losing her insurance coverage due to her work situation I will consult social worker to follow  Orders Placed This Encounter  Procedures   ECHOCARDIOGRAM COMPLETE    Standing Status:   Future    Expected Date:   03/15/2024    Expiration Date:   03/01/2025    Perflutren DEFINITY (image enhancing agent) should be administered unless hypersensitivity or allergy exist:   Administer Perflutren    Where should this test be performed[facility]    Does the patient weigh less than or greater than 250 lbs?:   Patient weighs less than 250 lbs    Reason for exam-Echo:   Chemo  Z09     Kristin Bedford, MD  INTERVAL HISTORY: she returns for treatment follow-up Complications related to previous cycle of chemotherapy included  thrombocytopenia, and concerned about her job She denies side effects such as mild sore or nausea She received a letter of denial from her workplace to extend her medical leave  PHYSICAL EXAMINATION: ECOG PERFORMANCE STATUS: 0 - Asymptomatic  No results found for: RJW874    Latest Ref Rng & Units 03/01/2024    9:27 AM 02/07/2024   12:23 PM 01/06/2024   11:43 AM  CBC  WBC 4.0 - 10.5 K/uL 4.9  6.6  5.7   Hemoglobin 12.0 - 15.0 g/dL 85.8  86.0  86.1   Hematocrit 36.0 - 46.0 % 42.0  42.6  41.8   Platelets 150 - 400 K/uL 427  276  371       Chemistry      Component Value Date/Time   NA 141 03/01/2024 0927   K 3.5 03/01/2024 0927   CL 105 03/01/2024 0927   CO2 27 03/01/2024 0927   BUN 11 03/01/2024 0927   CREATININE 0.80 03/01/2024 0927      Component Value Date/Time   CALCIUM  10.0 03/01/2024 0927   ALKPHOS 88 03/01/2024 0927   AST 18 03/01/2024 0927   ALT 19 03/01/2024 0927   BILITOT 0.3 03/01/2024 0927       There were no vitals filed for this visit. There were no vitals filed for this visit. Other relevant data reviewed during this visit included CBC and CMP

## 2024-03-01 NOTE — Patient Instructions (Signed)
 [facility] - A DEPT OF [facility]  Discharge Instructions: Thank you for choosing [facility] to provide your oncology and hematology care.   If you have a lab appointment with the Cancer Center, please go directly to the Cancer Center and check in at the registration area.   Wear comfortable clothing and clothing appropriate for easy access to any Portacath or PICC line.   We strive to give you quality time with your provider. You may need to reschedule your appointment if you arrive late (15 or more minutes).  Arriving late affects you and other patients whose appointments are after yours.  Also, if you miss three or more appointments without notifying the office, you may be dismissed from the clinic at the provider's discretion.      For prescription refill requests, have your pharmacy contact our office and allow 72 hours for refills to be completed.    Today you received the following chemotherapy and/or immunotherapy agents: DOXOrubicin  (ADRIAMYCIN )     To help prevent nausea and vomiting after your treatment, we encourage you to take your nausea medication as directed.  BELOW ARE SYMPTOMS THAT SHOULD BE REPORTED IMMEDIATELY: *FEVER GREATER THAN 100.4 F (38 C) OR HIGHER *CHILLS OR SWEATING *NAUSEA AND VOMITING THAT IS NOT CONTROLLED WITH YOUR NAUSEA MEDICATION *UNUSUAL SHORTNESS OF BREATH *UNUSUAL BRUISING OR BLEEDING *URINARY PROBLEMS (pain or burning when urinating, or frequent urination) *BOWEL PROBLEMS (unusual diarrhea, constipation, pain near the anus) TENDERNESS IN MOUTH AND THROAT WITH OR WITHOUT PRESENCE OF ULCERS (sore throat, sores in mouth, or a toothache) UNUSUAL RASH, SWELLING OR PAIN  UNUSUAL VAGINAL DISCHARGE OR ITCHING   Items with * indicate a potential emergency and should be followed up as soon as possible or go to the Emergency Department if any problems should occur.  Please show the CHEMOTHERAPY ALERT CARD or  IMMUNOTHERAPY ALERT CARD at check-in to the Emergency Department and triage nurse.  Should you have questions after your visit or need to cancel or reschedule your appointment, please contact [facility]  Dept: [phone]  and follow the prompts.  Office hours are 8:00 a.m. to 4:30 p.m. Monday - Friday. Please note that voicemails left after 4:00 p.m. may not be returned until the following business day.  We are closed weekends and major holidays. You have access to a nurse at all times for urgent questions. Please call the main number to the clinic Dept: [phone] and follow the prompts.   For any non-urgent questions, you may also contact your provider using MyChart. We now offer e-Visits for anyone 39 and older to request care online for non-urgent symptoms. For details visit [email or web address].   Also download the MyChart app! Go to the app store, search MyChart, open the app, select [place], and log in with your MyChart username and password.

## 2024-03-01 NOTE — Assessment & Plan Note (Addendum)
This is likely due to recent treatment. The patient denies recent history of bleeding such as epistaxis, hematuria or hematochezia. She is asymptomatic from the low platelet count. I will observe for now.  she does not require transfusion now. I will continue the chemotherapy at current dose without dosage adjustment.  If the thrombocytopenia gets progressive worse in the future, I might have to delay her treatment or adjust the chemotherapy dose.   

## 2024-03-01 NOTE — Assessment & Plan Note (Addendum)
 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

## 2024-03-01 NOTE — Progress Notes (Signed)
 [facility] Clinical Social Work  Clinical Social Work was referred by medical provider for insurance concerns.  Clinical Social Worker met with patient to offer support and assess for needs.    Patient's job is not extending her medical leave so she will be out of work plus her insurance will be ending at the end of November.  Pt has been told about COBRA but is concerned about the cost.  Interventions: Provided patient with information about insurance options and how to apply (Medicaid, marketplace plans). Informed of where & how to apply for Medicaid and info for [employer] for marketplace plans  Discussed disability and length of process Discussed applying for unemployment benefits Provided information on Triage Cancer and Cancer & Careers for additional support    Follow Up Plan:  Patient will apply for Medicaid and work with [employer] for [employer]. She will look into COBRA costs for just 1 month (December) if needed    Xayvier Vallez E Marwin Primmer, LCSW  Clinical Social Worker [facility]

## 2024-03-09 ENCOUNTER — Telehealth: Payer: Self-pay

## 2024-03-09 NOTE — Telephone Encounter (Signed)
 Returned her call. She is complaining of heart burns and burping. She is going to take Tums and will call the office back if needed or worsening symptoms.

## 2024-03-15 ENCOUNTER — Ambulatory Visit (HOSPITAL_COMMUNITY)
Admission: RE | Admit: 2024-03-15 | Discharge: 2024-03-15 | Disposition: A | Discharge status: Home / Self Care | Source: Ambulatory Visit | Attending: Hematology and Oncology | Admitting: Hematology and Oncology

## 2024-03-15 ENCOUNTER — Encounter: Payer: Self-pay | Admitting: Hematology and Oncology

## 2024-03-15 DIAGNOSIS — C549 Malignant neoplasm of corpus uteri, unspecified: Secondary | ICD-10-CM | POA: Diagnosis not present

## 2024-03-15 DIAGNOSIS — Z0189 Encounter for other specified special examinations: Secondary | ICD-10-CM | POA: Diagnosis not present

## 2024-03-15 DIAGNOSIS — Z01818 Encounter for other preprocedural examination: Secondary | ICD-10-CM | POA: Diagnosis not present

## 2024-03-15 LAB — ECHOCARDIOGRAM COMPLETE
Area-P 1/2: 3.16 cm2
Calc EF: 64.9 %
S' Lateral: 2.3 cm
Single Plane A2C EF: 59.6 %
Single Plane A4C EF: 65.8 %

## 2024-03-21 MED FILL — Fosaprepitant Dimeglumine For IV Infusion 150 MG (Base Eq): INTRAVENOUS | Qty: 5 | Status: AC

## 2024-03-22 ENCOUNTER — Inpatient Hospital Stay

## 2024-03-22 ENCOUNTER — Encounter: Payer: Self-pay | Admitting: Hematology and Oncology

## 2024-03-22 ENCOUNTER — Telehealth: Payer: Self-pay | Admitting: Hematology and Oncology

## 2024-03-22 ENCOUNTER — Inpatient Hospital Stay: Attending: Gynecologic Oncology

## 2024-03-22 ENCOUNTER — Inpatient Hospital Stay: Attending: Gynecologic Oncology | Admitting: Hematology and Oncology

## 2024-03-22 VITALS — HR 93

## 2024-03-22 VITALS — BP 157/86 | HR 117 | Temp 98.7°F | Resp 18 | Ht 64.0 in | Wt 172.4 lb

## 2024-03-22 DIAGNOSIS — Z5111 Encounter for antineoplastic chemotherapy: Secondary | ICD-10-CM | POA: Insufficient documentation

## 2024-03-22 DIAGNOSIS — C549 Malignant neoplasm of corpus uteri, unspecified: Secondary | ICD-10-CM

## 2024-03-22 DIAGNOSIS — C55 Malignant neoplasm of uterus, part unspecified: Secondary | ICD-10-CM | POA: Diagnosis not present

## 2024-03-22 DIAGNOSIS — D702 Other drug-induced agranulocytosis: Secondary | ICD-10-CM

## 2024-03-22 LAB — CMP (CANCER CENTER ONLY)
ALT: 21 U/L (ref 0–44)
AST: 21 U/L (ref 15–41)
Albumin: 4.4 g/dL (ref 3.5–5.0)
Alkaline Phosphatase: 97 U/L (ref 38–126)
Anion gap: 11 (ref 5–15)
BUN: 12 mg/dL (ref 6–20)
CO2: 27 mmol/L (ref 22–32)
Calcium: 9.9 mg/dL (ref 8.9–10.3)
Chloride: 102 mmol/L (ref 98–111)
Creatinine: 0.75 mg/dL (ref 0.44–1.00)
GFR, Estimated: >60 mL/min (ref >60)
Glucose, Bld: 114 mg/dL — ABNORMAL HIGH (ref 70–99)
Potassium: 3.5 mmol/L (ref 3.5–5.1)
Sodium: 141 mmol/L (ref 135–145)
Total Bilirubin: 0.3 mg/dL (ref 0.0–1.2)
Total Protein: 7.4 g/dL (ref 6.5–8.1)

## 2024-03-22 LAB — CBC WITH DIFFERENTIAL (CANCER CENTER ONLY)
Abs Immature Granulocytes: 0.03 K/uL (ref 0.00–0.07)
Basophils Absolute: 0.1 K/uL (ref 0.0–0.1)
Basophils Relative: 1 %
Eosinophils Absolute: 0.1 K/uL (ref 0.0–0.5)
Eosinophils Relative: 2 %
HCT: 39.5 % (ref 36.0–46.0)
Hemoglobin: 13 g/dL (ref 12.0–15.0)
Immature Granulocytes: 1 %
Lymphocytes Relative: 56 %
Lymphs Abs: 2.9 K/uL (ref 0.7–4.0)
MCH: 30 pg (ref 26.0–34.0)
MCHC: 32.9 g/dL (ref 30.0–36.0)
MCV: 91 fL (ref 80.0–100.0)
Monocytes Absolute: 0.8 K/uL (ref 0.1–1.0)
Monocytes Relative: 16 %
Neutro Abs: 1.3 K/uL — ABNORMAL LOW (ref 1.7–7.7)
Neutrophils Relative %: 24 %
Platelet Count: 373 K/uL (ref 150–400)
RBC: 4.34 MIL/uL (ref 3.87–5.11)
RDW: 16.7 % — ABNORMAL HIGH (ref 11.5–15.5)
WBC Count: 5.2 K/uL (ref 4.0–10.5)
nRBC: 0 % (ref 0.0–0.2)

## 2024-03-22 MED ORDER — SODIUM CHLORIDE 0.9% FLUSH: 10.0000 mL | INTRAVENOUS | Status: DC | PRN

## 2024-03-22 MED ORDER — SODIUM CHLORIDE 0.9 % IV SOLN: INTRAVENOUS | Status: DC

## 2024-03-22 MED ORDER — PALONOSETRON HCL INJECTION 0.25 MG/5ML
0.2500 mg | Freq: Once | INTRAVENOUS | Status: AC
  Administered 2024-03-22: 0.25 mg via INTRAVENOUS
  Filled 2024-03-22: qty 5

## 2024-03-22 MED ORDER — DOXORUBICIN HCL CHEMO IV INJECTION 2 MG/ML
60.0000 mg/m2 | Freq: Once | INTRAVENOUS | Status: AC
  Administered 2024-03-22: 110 mg via INTRAVENOUS
  Filled 2024-03-22: qty 55

## 2024-03-22 MED ORDER — DEXAMETHASONE SOD PHOSPHATE PF 10 MG/ML IJ SOLN
10.0000 mg | Freq: Once | INTRAMUSCULAR | Status: AC
  Administered 2024-03-22: 10 mg via INTRAVENOUS

## 2024-03-22 MED ORDER — SODIUM CHLORIDE 0.9 % IV SOLN
150.0000 mg | Freq: Once | INTRAVENOUS | Status: AC
  Administered 2024-03-22: 150 mg via INTRAVENOUS
  Filled 2024-03-22: qty 150

## 2024-03-22 NOTE — Assessment & Plan Note (Addendum)
 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

## 2024-03-22 NOTE — Assessment & Plan Note (Addendum)
 This is likely due to recent treatment. The patient denies recent history of fevers, cough, chills, diarrhea or dysuria. She is asymptomatic from the leukopenia. I will observe for now.  I will continue the chemotherapy at current dose without dosage adjustment.  If the leukopenia gets progressive worse in the future, I might have to delay her treatment or adjust the chemotherapy dose.

## 2024-03-22 NOTE — Patient Instructions (Signed)
 [facility] - A DEPT OF [facility]  Discharge Instructions: Thank you for choosing [facility] to provide your oncology and hematology care.   If you have a lab appointment with the Cancer Center, please go directly to the Cancer Center and check in at the registration area.   Wear comfortable clothing and clothing appropriate for easy access to any Portacath or PICC line.   We strive to give you quality time with your provider. You may need to reschedule your appointment if you arrive late (15 or more minutes).  Arriving late affects you and other patients whose appointments are after yours.  Also, if you miss three or more appointments without notifying the office, you may be dismissed from the clinic at the provider's discretion.      For prescription refill requests, have your pharmacy contact our office and allow 72 hours for refills to be completed.    Today you received the following chemotherapy and/or immunotherapy agents doxorubicin       To help prevent nausea and vomiting after your treatment, we encourage you to take your nausea medication as directed.  BELOW ARE SYMPTOMS THAT SHOULD BE REPORTED IMMEDIATELY: *FEVER GREATER THAN 100.4 F (38 C) OR HIGHER *CHILLS OR SWEATING *NAUSEA AND VOMITING THAT IS NOT CONTROLLED WITH YOUR NAUSEA MEDICATION *UNUSUAL SHORTNESS OF BREATH *UNUSUAL BRUISING OR BLEEDING *URINARY PROBLEMS (pain or burning when urinating, or frequent urination) *BOWEL PROBLEMS (unusual diarrhea, constipation, pain near the anus) TENDERNESS IN MOUTH AND THROAT WITH OR WITHOUT PRESENCE OF ULCERS (sore throat, sores in mouth, or a toothache) UNUSUAL RASH, SWELLING OR PAIN  UNUSUAL VAGINAL DISCHARGE OR ITCHING   Items with * indicate a potential emergency and should be followed up as soon as possible or go to the Emergency Department if any problems should occur.  Please show the CHEMOTHERAPY ALERT CARD or IMMUNOTHERAPY  ALERT CARD at check-in to the Emergency Department and triage nurse.  Should you have questions after your visit or need to cancel or reschedule your appointment, please contact [facility]  Dept: [phone]  and follow the prompts.  Office hours are 8:00 a.m. to 4:30 p.m. Monday - Friday. Please note that voicemails left after 4:00 p.m. may not be returned until the following business day.  We are closed weekends and major holidays. You have access to a nurse at all times for urgent questions. Please call the main number to the clinic Dept: [phone] and follow the prompts.   For any non-urgent questions, you may also contact your provider using MyChart. We now offer e-Visits for anyone 28 and older to request care online for non-urgent symptoms. For details visit [email or web address].   Also download the MyChart app! Go to the app store, search MyChart, open the app, select [place], and log in with your MyChart username and password.

## 2024-03-22 NOTE — Progress Notes (Signed)
 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 The patient denies recent history of fevers, cough, chills, diarrhea or dysuria. She is asymptomatic from the leukopenia. I will observe for now.  I will continue the chemotherapy at current dose without dosage adjustment.  If the leukopenia gets progressive worse in the future, I might have to delay her treatment or adjust the chemotherapy dose.   No orders of the defined types were placed in this encounter.    Almarie Bedford, MD  INTERVAL HISTORY: she returns for treatment follow-up Complications related to previous cycle of chemotherapy included leukopenia,  PHYSICAL EXAMINATION: ECOG PERFORMANCE STATUS: 0 - Asymptomatic  No results found for: RJW874    Latest Ref Rng & Units 03/22/2024   11:15 AM 03/01/2024    9:27 AM 02/07/2024   12:23 PM  CBC  WBC 4.0 - 10.5 K/uL 5.2  4.9  6.6   Hemoglobin 12.0 - 15.0 g/dL 86.9  85.8  86.0   Hematocrit 36.0 - 46.0 % 39.5  42.0  42.6   Platelets 150 - 400 K/uL 373  427  276       Chemistry      Component Value Date/Time   NA 141 03/22/2024 1115   K 3.5 03/22/2024 1115   CL 102 03/22/2024 1115   CO2 27 03/22/2024 1115   BUN 12 03/22/2024 1115   CREATININE 0.75 03/22/2024 1115      Component Value Date/Time   CALCIUM   9.9 03/22/2024 1115   ALKPHOS 97 03/22/2024 1115   AST 21 03/22/2024 1115   ALT 21 03/22/2024 1115   BILITOT 0.3 03/22/2024 1115       Vitals:   03/22/24 1142  BP: (!) 157/86  Pulse: (!) 117  Resp: 18  Temp: 98.7 F (37.1 C)  SpO2: 100%   Filed Weights   03/22/24 1142  Weight: 172 lb 6.4 oz (78.2 kg)   Other relevant data reviewed during this visit included CBC, CMP, echocardiogram

## 2024-03-22 NOTE — Telephone Encounter (Signed)
 left vm for pt about scheduled tx appt date and times. Encouraged to call back with any questions or concerns

## 2024-04-11 MED FILL — Fosaprepitant Dimeglumine For IV Infusion 150 MG (Base Eq): INTRAVENOUS | Qty: 5 | Status: AC

## 2024-04-12 ENCOUNTER — Telehealth: Payer: Self-pay | Admitting: Hematology and Oncology

## 2024-04-12 ENCOUNTER — Inpatient Hospital Stay

## 2024-04-12 ENCOUNTER — Encounter: Payer: Self-pay | Admitting: Hematology and Oncology

## 2024-04-12 ENCOUNTER — Inpatient Hospital Stay (HOSPITAL_BASED_OUTPATIENT_CLINIC_OR_DEPARTMENT_OTHER): Admitting: Hematology and Oncology

## 2024-04-12 ENCOUNTER — Inpatient Hospital Stay: Attending: Gynecologic Oncology

## 2024-04-12 VITALS — BP 122/70 | HR 62 | Temp 98.2°F | Resp 17 | Ht 64.0 in | Wt 172.2 lb

## 2024-04-12 DIAGNOSIS — C549 Malignant neoplasm of corpus uteri, unspecified: Secondary | ICD-10-CM

## 2024-04-12 DIAGNOSIS — C55 Malignant neoplasm of uterus, part unspecified: Secondary | ICD-10-CM | POA: Insufficient documentation

## 2024-04-12 DIAGNOSIS — Z5111 Encounter for antineoplastic chemotherapy: Secondary | ICD-10-CM | POA: Diagnosis present

## 2024-04-12 LAB — CBC WITH DIFFERENTIAL (CANCER CENTER ONLY)
Abs Immature Granulocytes: 0.01 K/uL (ref 0.00–0.07)
Basophils Absolute: 0.1 K/uL (ref 0.0–0.1)
Basophils Relative: 1 %
Eosinophils Absolute: 0.2 K/uL (ref 0.0–0.5)
Eosinophils Relative: 3 %
HCT: 38.7 % (ref 36.0–46.0)
Hemoglobin: 12.7 g/dL (ref 12.0–15.0)
Immature Granulocytes: 0 %
Lymphocytes Relative: 51 %
Lymphs Abs: 2.7 K/uL (ref 0.7–4.0)
MCH: 30.2 pg (ref 26.0–34.0)
MCHC: 32.8 g/dL (ref 30.0–36.0)
MCV: 92.1 fL (ref 80.0–100.0)
Monocytes Absolute: 0.9 K/uL (ref 0.1–1.0)
Monocytes Relative: 16 %
Neutro Abs: 1.5 K/uL — ABNORMAL LOW (ref 1.7–7.7)
Neutrophils Relative %: 29 %
Platelet Count: 383 K/uL (ref 150–400)
RBC: 4.2 MIL/uL (ref 3.87–5.11)
RDW: 16 % — ABNORMAL HIGH (ref 11.5–15.5)
WBC Count: 5.4 K/uL (ref 4.0–10.5)
nRBC: 0 % (ref 0.0–0.2)

## 2024-04-12 LAB — CMP (CANCER CENTER ONLY)
ALT: 18 U/L (ref 0–44)
AST: 21 U/L (ref 15–41)
Albumin: 4.4 g/dL (ref 3.5–5.0)
Alkaline Phosphatase: 94 U/L (ref 38–126)
Anion gap: 10 (ref 5–15)
BUN: 7 mg/dL (ref 6–20)
CO2: 27 mmol/L (ref 22–32)
Calcium: 10 mg/dL (ref 8.9–10.3)
Chloride: 104 mmol/L (ref 98–111)
Creatinine: 0.78 mg/dL (ref 0.44–1.00)
GFR, Estimated: >60 mL/min (ref >60)
Glucose, Bld: 104 mg/dL — ABNORMAL HIGH (ref 70–99)
Potassium: 3.6 mmol/L (ref 3.5–5.1)
Sodium: 142 mmol/L (ref 135–145)
Total Bilirubin: 0.3 mg/dL (ref 0.0–1.2)
Total Protein: 7.7 g/dL (ref 6.5–8.1)

## 2024-04-12 MED ORDER — SODIUM CHLORIDE 0.9 % IV SOLN: INTRAVENOUS | Status: DC

## 2024-04-12 MED ORDER — SODIUM CHLORIDE 0.9 % IV SOLN
150.0000 mg | Freq: Once | INTRAVENOUS | Status: AC
  Administered 2024-04-12: 150 mg via INTRAVENOUS
  Filled 2024-04-12: qty 150

## 2024-04-12 MED ORDER — PALONOSETRON HCL INJECTION 0.25 MG/5ML
0.2500 mg | Freq: Once | INTRAVENOUS | Status: AC
  Administered 2024-04-12: 0.25 mg via INTRAVENOUS
  Filled 2024-04-12: qty 5

## 2024-04-12 MED ORDER — DOXORUBICIN HCL CHEMO IV INJECTION 2 MG/ML
60.0000 mg/m2 | Freq: Once | INTRAVENOUS | Status: AC
  Administered 2024-04-12: 110 mg via INTRAVENOUS
  Filled 2024-04-12: qty 55

## 2024-04-12 MED ORDER — DEXAMETHASONE SOD PHOSPHATE PF 10 MG/ML IJ SOLN
10.0000 mg | Freq: Once | INTRAMUSCULAR | Status: AC
  Administered 2024-04-12: 10 mg via INTRAVENOUS

## 2024-04-12 NOTE — Assessment & Plan Note (Addendum)
 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

## 2024-04-12 NOTE — Telephone Encounter (Signed)
 left vm for pt about scheduled per 12/11 los appt dates and times

## 2024-04-12 NOTE — Patient Instructions (Signed)
 [facility] - A DEPT OF [facility]  Discharge Instructions: Thank you for choosing [facility] to provide your oncology and hematology care.   If you have a lab appointment with the Cancer Center, please go directly to the Cancer Center and check in at the registration area.   Wear comfortable clothing and clothing appropriate for easy access to any Portacath or PICC line.   We strive to give you quality time with your provider. You may need to reschedule your appointment if you arrive late (15 or more minutes).  Arriving late affects you and other patients whose appointments are after yours.  Also, if you miss three or more appointments without notifying the office, you may be dismissed from the clinic at the provider's discretion.      For prescription refill requests, have your pharmacy contact our office and allow 72 hours for refills to be completed.    Today you received the following chemotherapy and/or immunotherapy agents doxorubicin       To help prevent nausea and vomiting after your treatment, we encourage you to take your nausea medication as directed.  BELOW ARE SYMPTOMS THAT SHOULD BE REPORTED IMMEDIATELY: *FEVER GREATER THAN 100.4 F (38 C) OR HIGHER *CHILLS OR SWEATING *NAUSEA AND VOMITING THAT IS NOT CONTROLLED WITH YOUR NAUSEA MEDICATION *UNUSUAL SHORTNESS OF BREATH *UNUSUAL BRUISING OR BLEEDING *URINARY PROBLEMS (pain or burning when urinating, or frequent urination) *BOWEL PROBLEMS (unusual diarrhea, constipation, pain near the anus) TENDERNESS IN MOUTH AND THROAT WITH OR WITHOUT PRESENCE OF ULCERS (sore throat, sores in mouth, or a toothache) UNUSUAL RASH, SWELLING OR PAIN  UNUSUAL VAGINAL DISCHARGE OR ITCHING   Items with * indicate a potential emergency and should be followed up as soon as possible or go to the Emergency Department if any problems should occur.  Please show the CHEMOTHERAPY ALERT CARD or IMMUNOTHERAPY  ALERT CARD at check-in to the Emergency Department and triage nurse.  Should you have questions after your visit or need to cancel or reschedule your appointment, please contact [facility]  Dept: [phone]  and follow the prompts.  Office hours are 8:00 a.m. to 4:30 p.m. Monday - Friday. Please note that voicemails left after 4:00 p.m. may not be returned until the following business day.  We are closed weekends and major holidays. You have access to a nurse at all times for urgent questions. Please call the main number to the clinic Dept: [phone] and follow the prompts.   For any non-urgent questions, you may also contact your provider using MyChart. We now offer e-Visits for anyone 28 and older to request care online for non-urgent symptoms. For details visit [email or web address].   Also download the MyChart app! Go to the app store, search MyChart, open the app, select [place], and log in with your MyChart username and password.

## 2024-04-12 NOTE — Progress Notes (Signed)
 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 with abnormal imaging in May 2025 and subsequently underwent surgery in July 2025 Pathology: High-grade sarcoma  Review of CT imaging from July 2025 which showed no evidence of residual disease She tolerated reatment very well with no major side effects I plan to repeat imaging study after completion of chemotherapy in January Her recent echocardiogram was within normal range We will proceed with treatment today without delay   Orders Placed This Encounter  Procedures   CT ABDOMEN PELVIS W CONTRAST    Standing Status:   Future    Expected Date:   05/14/2024    Expiration Date:   04/12/2025    Scheduling Instructions:     No oral contrast    If indicated for the ordered procedure, I authorize the administration of contrast media per Radiology protocol:   Yes    Does the patient have a contrast media/X-ray dye allergy?:   No    Is patient pregnant?:   No    Preferred imaging location?:   [facility]    If indicated for the ordered procedure, I authorize the administration of oral contrast media per Radiology protocol:   No    Reason for no oral contrast::   No need oral contrast   CBC with Differential/Platelet    Standing Status:   Standing    Number of Occurrences:   22    Expiration Date:   04/12/2025   Comprehensive metabolic panel with GFR    Standing Status:   Standing    Number of Occurrences:   33    Expiration Date:   04/12/2025     Kristin Bedford, MD  INTERVAL HISTORY: she returns for treatment follow-up Complications related to previous cycle of chemotherapy included Changes in hernails Denies nausea She had brief episode of abdominal pain that has since resolved  PHYSICAL EXAMINATION: ECOG PERFORMANCE STATUS: 0 -  Asymptomatic  No results found for: CAN125    Latest Ref Rng & Units 04/12/2024   12:58 PM 03/22/2024   11:15 AM 03/01/2024    9:27 AM  CBC  WBC 4.0 - 10.5 K/uL 5.4  5.2  4.9   Hemoglobin 12.0 - 15.0 g/dL 87.2  86.9  85.8   Hematocrit 36.0 - 46.0 % 38.7  39.5  42.0   Platelets 150 - 400 K/uL 383  373  427       Chemistry      Component Value Date/Time   NA 142 04/12/2024 1258   K 3.6 04/12/2024 1258   CL 104 04/12/2024 1258   CO2 27 04/12/2024 1258   BUN 7 04/12/2024 1258   CREATININE 0.78 04/12/2024 1258      Component Value Date/Time   CALCIUM  10.0 04/12/2024 1258   ALKPHOS 94 04/12/2024 1258   AST 21 04/12/2024 1258   ALT 18 04/12/2024 1258   BILITOT 0.3 04/12/2024 1258       There were no vitals filed for this visit. There were no vitals filed for this visit. Other relevant data reviewed during this visit included CBC and CMP

## 2024-04-13 ENCOUNTER — Other Ambulatory Visit: Payer: Self-pay

## 2024-04-20 ENCOUNTER — Telehealth: Payer: Self-pay

## 2024-04-20 NOTE — Telephone Encounter (Signed)
 She called requesting a letter for her mortgage company with all chemo dates and surgery date in July. She has been out of work.  Told her the office will call her back when letter has been completed.

## 2024-04-23 ENCOUNTER — Telehealth: Payer: Self-pay

## 2024-04-23 NOTE — Telephone Encounter (Signed)
 Patient requested a letter for her mortgage company. The letter was generated and sent to the patients email account, and receipt was confirmed by the patient.

## 2024-05-11 ENCOUNTER — Encounter: Payer: Self-pay | Admitting: Hematology and Oncology

## 2024-05-11 ENCOUNTER — Inpatient Hospital Stay: Attending: Gynecologic Oncology | Admitting: Gynecologic Oncology

## 2024-05-11 ENCOUNTER — Encounter: Payer: Self-pay | Admitting: Gynecologic Oncology

## 2024-05-11 VITALS — BP 134/85 | HR 96 | Temp 99.0°F | Resp 19 | Wt 171.2 lb

## 2024-05-11 DIAGNOSIS — Z9221 Personal history of antineoplastic chemotherapy: Secondary | ICD-10-CM | POA: Insufficient documentation

## 2024-05-11 DIAGNOSIS — Z5111 Encounter for antineoplastic chemotherapy: Secondary | ICD-10-CM | POA: Diagnosis present

## 2024-05-11 DIAGNOSIS — C549 Malignant neoplasm of corpus uteri, unspecified: Secondary | ICD-10-CM | POA: Diagnosis not present

## 2024-05-11 DIAGNOSIS — Z9079 Acquired absence of other genital organ(s): Secondary | ICD-10-CM | POA: Insufficient documentation

## 2024-05-11 DIAGNOSIS — Z90710 Acquired absence of both cervix and uterus: Secondary | ICD-10-CM | POA: Insufficient documentation

## 2024-05-11 DIAGNOSIS — C55 Malignant neoplasm of uterus, part unspecified: Secondary | ICD-10-CM | POA: Diagnosis not present

## 2024-05-11 DIAGNOSIS — Z90722 Acquired absence of ovaries, bilateral: Secondary | ICD-10-CM | POA: Diagnosis not present

## 2024-05-11 NOTE — Progress Notes (Signed)
 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 Large pelvic mass measuring 14.7 x 14.7 x 12.0 cm. This is likely a degenerated/liquid fundal fibroid. On the prior CT scan (2018) it measured 8.6 x 8.0 x 7.8 cm. 2. Multiple hepatic cysts showing interval enlargement since the prior CT scan. 3. No acute abdominal/pelvic findings or adenopathy.   10/24/2023 Imaging   1. No acute findings.  No significant pulmonary lesion. 2. Aberrant right subclavian artery, anatomic variant.   11/02/2023 Initial Diagnosis   Malignant neoplasm of uterus (HCC)   11/02/2023 Pathology Results   SURGICAL PATHOLOGY  CASE: [id number]  PATIENT: Kristin [name]  Surgical Pathology Report   Clinical History: Uterine mass (las)   FINAL MICROSCOPIC DIAGNOSIS:  A. UTERUS, CERVIX, FALLOPIAN TUBE, OVARY, BILATERAL, HYSTERECTOMY:  - High-grade sarcoma involving the uterus, see comment   B. CECAL MESENTERY IMPLANT, BIOPSY:  -  High-grade sarcoma, see comment   C. OMENTAL NODULE, BIOPSY:  - Portion of omentum with mild fibrosis, negative for malignancy   D. OMENTUM:  - High-grade sarcoma, see comment   Multiple immunohistochemical stains are performed for characterization of the malignant lesions but are noncontributory.  The case will be sent out to [facility], [place] for a consult and an addendum will follow.     11/02/2023 Surgery   Pre-operative Diagnosis: Uterine mass   Post-operative Diagnosis: same, at least stage III uterine cancer   Operation: Robotic-assisted laparoscopic total hysterectomy with bilateral salpingo-oophorectomy (uterus > 250 gms), laparotomy for specimen removal, resection of omentum adherent to the cecum   Surgeon: Viktoria Crank MD     Operative Findings:  On EUA, enlarged mobile uterus.   On intra-abdominal examination, normal upper abdominal survey.  Large, approximately 15-16 cm mass arising from the uterine fundus.  Lower uterine segment itself is normal in appearance.  Significant enlargement of ovarian vasculature as well as enlarged uterine arteries.  Normal-appearing bilateral tubes and ovaries.  No ascites.  No obvious pelvic adenopathy.  Only after a portion of the hysterectomy had been completed was not apparent that there was some tumor perforating through a small, less than 1 cm area at the superior right aspect of the uterine mass with adjacent tumor involving omentum that was adherent to the cecum.  Otherwise, omentum with several small areas of thickened tissue, no visible abnormalities.  Small bowel run from the cecum to the ligament of Treitz with no significant findings.  No gross disease at the end of surgery. 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     11/25/2023 Cancer Staging   Staging form: Corpus Uteri - Leiomyosarcoma and Endometrial Stromal Sarcoma, AJCC 8th Edition - Pathologic stage from 11/25/2023: FIGO Stage IIIB (pT3b, pN0, cM0) - Signed by Lonn Hicks, MD on 11/25/2023 Stage prefix: Initial diagnosis   11/30/2023 Imaging   CT CHEST ABDOMEN PELVIS W CONTRAST Result Date: 12/07/2023 EXAM:  CT CHEST ABDOMEN PELVIS WITH IV CONTRAST INDICATION:  staging uterine cancer, high grade sarcoma TECHNIQUE: Spiral CT scanning was performed through the chest, abdomen and pelvis after the patient received oral and IV contrast. COMPARISON: 10/24/2023, 07/14/2023 FINDINGS: The cardiac size is within normal limits. There is no thoracic aortic aneurysm. No filling defects are identified in the central pulmonary arteries. The esophagus and thyroid   glands have a normal appearance. There is no mass or adenopathy in the chest. No pleural or pericardial effusion is present. Mild linear scarring is present in the  left lower lobe. The lungs are otherwise clear. There is no suspicious pulmonary mass. Multiple hepatic cysts are present. These are unchanged and do not require imaging follow-up. There is no significant abnormality identified in the spleen, pancreas and gallbladder. No calculus, obstruction, or soft tissue mass is present involving the kidneys. There are no adrenal masses. The abdominal bowel loops are unremarkable except for mild stool retention. There is no evidence of ascites or adenopathy. No abdominal aortic aneurysm is present. The patient has had interval hysterectomy with removal of the large pelvic mass. The bladder has a normal appearance. There is no recurrent mass identified. There is no inguinal hernia. The pelvic bowel loops have a normal appearance. There is no fracture or bone destruction. IMPRESSION: No evidence of acute process. No tumor recurrence or metastatic disease is identified. Please note that CT scanning at this site utilizes multiple dose reduction techniques, including automatic exposure control, adjustment of the MAA and/or KVP according to the patient's size, and use of iterative reconstruction. Electronically signed by: Eddy Oar MD 12/07/2023 10:31 AM EDT RP Workstation: [id number]   IR IMAGING GUIDED PORT INSERTION Result Date: 12/02/2023 CLINICAL DATA:  Endometrial carcinoma and need for porta cath for chemotherapy. EXAM: IMPLANTED PORT A CATH PLACEMENT WITH ULTRASOUND AND FLUOROSCOPIC GUIDANCE ANESTHESIA/SEDATION: Moderate (conscious) sedation was employed during this procedure. A total of Versed  2.0 mg and Fentanyl  100 mcg was administered intravenously. Moderate Sedation Time: 42 minutes. The patient's level of consciousness and vital signs were monitored continuously by radiology nursing throughout the procedure under my direct supervision. FLUOROSCOPY: Radiation Exposure Index: 2.0 mGy Kerma PROCEDURE: The procedure, risks, benefits, and alternatives were explained to  the patient. Questions regarding the procedure were encouraged and answered. The patient understands and consents to the procedure. A time-out was performed prior to initiating the procedure. Ultrasound was utilized to confirm patency of the right internal jugular vein. An ultrasound image was saved and recorded. The right neck and chest were prepped with chlorhexidine  in a sterile fashion, and a sterile drape was applied covering the operative field. Maximum barrier sterile technique with sterile gowns and gloves were used for the procedure. Local anesthesia was provided with 1% lidocaine . After creating a small venotomy incision, a 21 gauge needle was advanced into the right internal jugular vein under direct, real-time ultrasound guidance. Ultrasound image documentation was performed. After securing guidewire access, an 8 Fr dilator was placed. A J-wire was kinked to measure appropriate catheter length. A subcutaneous port pocket was then created along the upper chest wall utilizing sharp and blunt dissection. Portable cautery was utilized. The pocket was irrigated with sterile saline. A single lumen power injectable port was chosen for placement. The 8 Fr catheter was tunneled from the port pocket site to the venotomy incision. The port was placed in the pocket. External catheter was trimmed to appropriate length based on guidewire measurement. At the venotomy, an 8 Fr peel-away sheath was placed over a guidewire. The catheter was then placed through the sheath and the sheath removed. Final catheter positioning was confirmed and documented with a fluoroscopic spot image. The port was accessed with a needle and aspirated and flushed with heparinized saline. The access needle was removed. The venotomy and port pocket incisions were closed with subcutaneous 3-0 Monocryl and subcuticular 4-0 Vicryl. Dermabond was applied to both  incisions. COMPLICATIONS: COMPLICATIONS None FINDINGS: After catheter placement, the tip  lies at the cavo-atrial junction. The catheter aspirates normally and is ready for immediate use. IMPRESSION: Placement of single lumen port a cath via right internal jugular vein. The catheter tip lies at the cavo-atrial junction. A power injectable port a cath was placed and is ready for immediate use. Electronically Signed   By: Marcey Moan M.D.   On: 12/02/2023 13:51      12/14/2023 Echocardiogram   1. Left ventricular ejection fraction, by estimation, is 60 to 65%. Left ventricular ejection fraction by 3D volume is 58 %. The left ventricle has normal function. The left ventricle has no regional wall motion abnormalities. Left ventricular diastolic parameters were normal.  2. Right ventricular systolic function is normal. The right ventricular size is normal. Tricuspid regurgitation signal is inadequate for assessing PA pressure.  3. The mitral valve is normal in structure. Trivial mitral valve regurgitation. No evidence of mitral stenosis.  4. The aortic valve is tricuspid. Aortic valve regurgitation is not visualized. No aortic stenosis is present.  5. The inferior vena cava is normal in size with greater than 50% respiratory variability, suggesting right atrial pressure of 3 mmHg.   12/16/2023 -  Chemotherapy   Patient is on Treatment Plan : SARCOMA Doxorubicin  (60) q21d     03/15/2024 Echocardiogram   1. Left ventricular ejection fraction, by estimation, is 65 to 70%. Left ventricular ejection fraction by PLAX is 79 %. The left ventricle has normal function. The left ventricle has no regional wall motion abnormalities. Left ventricular diastolic parameters were normal. The average left ventricular global longitudinal strain is -17.9 %. The global longitudinal strain is normal.  2. Right ventricular systolic function is normal. The right ventricular size is normal. Tricuspid regurgitation signal is inadequate for assessing PA pressure.  3. The mitral valve is normal in structure. Trivial  mitral valve regurgitation. No evidence of mitral stenosis.  4. The aortic valve is tricuspid. Aortic valve regurgitation is not visualized. No aortic stenosis is present.  5. The inferior vena cava is normal in size with greater than 50% respiratory variability, suggesting right atrial pressure of 3 mmHg.       Interval History: Doing well.  Denies any vaginal bleeding.  Reports good energy.  Endorses normal bowel and bladder function.  Past Medical/Surgical History: Past Medical History:  Diagnosis Date   Anemia    Arthritis    knee, back; improved with weight loss   Asthma    Dry eyes    GERD (gastroesophageal reflux disease)    Hyperlipidemia    Uterine fibroid     Past Surgical History:  Procedure Laterality Date   COLONOSCOPY     DEBULKING N/A 11/02/2023   Procedure: DEBULKING, ABDOMINAL;  Surgeon: Viktoria Comer SAUNDERS, MD;  Location: [facility];  Service: Gynecology;  Laterality: N/A;  POSSIBLE DEBULKING   DILATATION & CURETTAGE/HYSTEROSCOPY WITH MYOSURE N/A 04/29/2016   Procedure: DILATATION & CURETTAGE/HYSTEROSCOPY WITH MYOSURE;  Surgeon: Dickie Carder, MD;  Location: [facility];  Service: Gynecology;  Laterality: N/A;   IR IMAGING GUIDED PORT INSERTION  12/02/2023   LAPAROSCOPY N/A 11/02/2023   Procedure: LAPAROSCOPY, DIAGNOSTIC;  Surgeon: Viktoria Comer SAUNDERS, MD;  Location: [facility];  Service: Gynecology;  Laterality: N/A;   LAPAROTOMY N/A 11/02/2023   Procedure: MINI LAPAROTOMY;  Surgeon: Viktoria Comer SAUNDERS, MD;  Location: [facility];  Service: Gynecology;  Laterality: N/A;   ROBOTIC ASSISTED TOTAL HYSTERECTOMY WITH BILATERAL SALPINGO OOPHERECTOMY N/A 11/02/2023  Procedure: HYSTERECTOMY, TOTAL, ROBOT-ASSISTED, LAPAROSCOPIC, WITH BILATERAL SALPINGO-OOPHORECTOMY;  Surgeon: Viktoria Comer SAUNDERS, MD;  Location: [facility];  Service: Gynecology;  Laterality: N/A;   TUBAL LIGATION     UPPER GI ENDOSCOPY      Family History  Problem Relation Age of Onset   Breast cancer Neg Hx    Colon  cancer Neg Hx    Pancreatic cancer Neg Hx    Prostate cancer Neg Hx    Ovarian cancer Neg Hx    Endometrial cancer Neg Hx     Social History   Socioeconomic History   Marital status: Married    Spouse name: Not on file   Number of children: Not on file   Years of education: Not on file   Highest education level: Not on file  Occupational History   Not on file  Tobacco Use   Smoking status: Never    Passive exposure: Never   Smokeless tobacco: Never  Vaping Use   Vaping status: Never Used  Substance and Sexual Activity   Alcohol  use: No   Drug use: No   Sexual activity: Not on file  Other Topics Concern   Not on file  Social History Narrative   Not on file   Social Drivers of Health   Tobacco Use: Low Risk (04/12/2024)   Patient History    Smoking Tobacco Use: Never    Smokeless Tobacco Use: Never    Passive Exposure: Never  Financial Resource Strain: Low Risk (10/17/2023)   Received from [facility]   Overall Financial Resource Strain (CARDIA)    Difficulty of Paying Living Expenses: Not hard at all  Food Insecurity: No Food Insecurity (11/02/2023)   Epic    Worried About Radiation Protection Practitioner of Food in the Last Year: Never true    Ran Out of Food in the Last Year: Never true  Transportation Needs: No Transportation Needs (11/02/2023)   Epic    Lack of Transportation (Medical): No    Lack of Transportation (Non-Medical): No  Physical Activity: Not on file  Stress: Not on file  Social Connections: Not on file  Depression (PHQ2-9): Low Risk (03/22/2024)   Depression (PHQ2-9)    PHQ-2 Score: 0  Alcohol  Screen: Not on file  Housing: Low Risk (11/02/2023)   Epic    Unable to Pay for Housing in the Last Year: No    Number of Times Moved in the Last Year: 0    Homeless in the Last Year: No  Utilities: Not At Risk (11/02/2023)   Epic    Threatened with loss of utilities: No  Health Literacy: Not on file    Current Medications: Current Medications[1]  Review of  Systems: + wheezing Denies appetite changes, fevers, chills, fatigue, unexplained weight changes. Denies hearing loss, neck lumps or masses, mouth sores, ringing in ears or voice changes. Denies cough.  Denies shortness of breath. Denies chest pain or palpitations. Denies leg swelling. Denies abdominal distention, pain, blood in stools, constipation, diarrhea, nausea, vomiting, or early satiety. Denies pain with intercourse, dysuria, frequency, hematuria or incontinence. Denies hot flashes, pelvic pain, vaginal bleeding or vaginal discharge.   Denies joint pain, back pain or muscle pain/cramps. Denies itching, rash, or wounds. Denies dizziness, headaches, numbness or seizures. Denies swollen lymph nodes or glands, denies easy bruising or bleeding. Denies anxiety, depression, confusion, or decreased concentration.  Physical Exam: BP 134/85 (BP Location: Right Arm, Patient Position: Sitting)   Pulse 96   Temp 99 F (37.2 C) (Oral)  Resp 19   Wt 171 lb 3.2 oz (77.7 kg)   SpO2 100%   BMI 29.39 kg/m  General: Alert, oriented, no acute distress. HEENT: Posterior oropharynx clear, sclera anicteric. Chest: Clear to auscultation bilaterally.  No wheezing or rhonchi. Cardiovascular: Heart rate in the 90s, regular rhythm, no murmurs. Abdomen: soft, nontender.  Normoactive bowel sounds.  No masses or hepatosplenomegaly appreciated.  Well-healed incisions. Extremities: Grossly normal range of motion.  Warm, well perfused.  No edema bilaterally. Skin: No rashes or lesions noted. Lymphatics: No cervical, supraclavicular, or inguinal adenopathy. GU: Normal appearing external genitalia without erythema, excoriation, or lesions.  Speculum exam reveals cuff intact, minimal scar tissue at the apex.  No lesions.  Bimanual exam reveals no nodularity or masses appreciated.  Rectovaginal exam confirms these findings.  Laboratory & Radiologic Studies: None new  Assessment & Plan: Kristin [name] is a  59 y.o. 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  Second opinion from [facility] consistent with dedifferentiated uterine leiomyosarcoma, high-grade. Completed cycle 6 of doxorubicin  on 12/11. Scheduled for interval imaging next week.  Patient is overall doing well.  Tolerated treatment well without significant side effects. She is scheduled for post-treatment imaging next week.  We discussed signs and symptoms that should prompt a phone call between visits.  I will see the patient back for follow-up in 4 months.  Discussed with her that she could reach out to get her colonoscopy and EGD performed.  20 minutes of total time was spent for this patient encounter, including preparation, face-to-face counseling with the patient and coordination of care, and documentation of the encounter.  Comer Dollar, MD  Division of Gynecologic Oncology  Department of Obstetrics and Gynecology  [facility] ;     [1]  Current Outpatient Medications:    albuterol  (PROVENTIL ) (2.5 MG/3ML) 0.083% nebulizer solution, Take 2.5 mg by nebulization every 6 (six) hours as needed for wheezing., Disp: , Rfl:    albuterol  (VENTOLIN  HFA) 108 (90 Base) MCG/ACT inhaler, Inhale 1-2 puffs into the lungs every 6 (six) hours as needed for wheezing or shortness of breath., Disp: 18 g, Rfl: 0   atorvastatin  (LIPITOR) 20 MG tablet, Take 20 mg by mouth at bedtime. (Patient not taking: Reported on 01/31/2024), Disp: , Rfl:    lidocaine -prilocaine  (EMLA ) cream, Apply to affected area once, Disp: 30 g, Rfl: 3   Lifitegrast  (XIIDRA ) 5 % SOLN, Place 1 drop into both eyes in the morning., Disp: , Rfl:    Multiple Vitamin (MULTIVITAMIN WITH MINERALS) TABS tablet, Take 1 tablet by mouth daily., Disp: , Rfl:    ondansetron  (ZOFRAN ) 8 MG tablet, Take 1 tablet (8 mg total) by mouth every 8 (eight) hours as needed for nausea or vomiting. Start on the third day after chemotherapy., Disp: 30 tablet, Rfl:  1   Polyethyl Glycol-Propyl Glycol (SYSTANE OP), Place 1 drop into both eyes 3 (three) times daily as needed (dry/irritated eyes.)., Disp: , Rfl:    prochlorperazine  (COMPAZINE ) 10 MG tablet, Take 1 tablet (10 mg total) by mouth every 6 (six) hours as needed for nausea or vomiting., Disp: 30 tablet, Rfl: 1

## 2024-05-11 NOTE — Patient Instructions (Signed)
 It was good to see you today.  I do not see or feel any evidence of cancer recurrence on your exam.  I will see you for follow-up in 4 months.  As always, if you develop any new and concerning symptoms before your next visit, please call to see me sooner.

## 2024-05-12 ENCOUNTER — Other Ambulatory Visit: Payer: Self-pay

## 2024-05-14 ENCOUNTER — Inpatient Hospital Stay

## 2024-05-14 ENCOUNTER — Ambulatory Visit (HOSPITAL_COMMUNITY)
Admission: RE | Admit: 2024-05-14 | Discharge: 2024-05-14 | Disposition: A | Discharge status: Home / Self Care | Source: Ambulatory Visit | Attending: Hematology and Oncology | Admitting: Hematology and Oncology

## 2024-05-14 ENCOUNTER — Encounter (HOSPITAL_COMMUNITY): Payer: Self-pay

## 2024-05-14 DIAGNOSIS — C549 Malignant neoplasm of corpus uteri, unspecified: Secondary | ICD-10-CM

## 2024-05-14 DIAGNOSIS — C55 Malignant neoplasm of uterus, part unspecified: Secondary | ICD-10-CM | POA: Diagnosis not present

## 2024-05-14 LAB — CBC WITH DIFFERENTIAL/PLATELET
Abs Immature Granulocytes: 0.01 K/uL (ref 0.00–0.07)
Basophils Absolute: 0 K/uL (ref 0.0–0.1)
Basophils Relative: 1 %
Eosinophils Absolute: 0.4 K/uL (ref 0.0–0.5)
Eosinophils Relative: 7 %
HCT: 41 % (ref 36.0–46.0)
Hemoglobin: 13.3 g/dL (ref 12.0–15.0)
Immature Granulocytes: 0 %
Lymphocytes Relative: 41 %
Lymphs Abs: 2.5 K/uL (ref 0.7–4.0)
MCH: 29.6 pg (ref 26.0–34.0)
MCHC: 32.4 g/dL (ref 30.0–36.0)
MCV: 91.3 fL (ref 80.0–100.0)
Monocytes Absolute: 0.6 K/uL (ref 0.1–1.0)
Monocytes Relative: 11 %
Neutro Abs: 2.4 K/uL (ref 1.7–7.7)
Neutrophils Relative %: 40 %
Platelets: 324 K/uL (ref 150–400)
RBC: 4.49 MIL/uL (ref 3.87–5.11)
RDW: 15 % (ref 11.5–15.5)
WBC: 6 K/uL (ref 4.0–10.5)
nRBC: 0 % (ref 0.0–0.2)

## 2024-05-14 LAB — COMPREHENSIVE METABOLIC PANEL WITH GFR
ALT: 28 U/L (ref 0–44)
AST: 27 U/L (ref 15–41)
Albumin: 4.3 g/dL (ref 3.5–5.0)
Alkaline Phosphatase: 124 U/L (ref 38–126)
Anion gap: 10 (ref 5–15)
BUN: 12 mg/dL (ref 6–20)
CO2: 27 mmol/L (ref 22–32)
Calcium: 9.8 mg/dL (ref 8.9–10.3)
Chloride: 104 mmol/L (ref 98–111)
Creatinine, Ser: 0.71 mg/dL (ref 0.44–1.00)
GFR, Estimated: >60 mL/min
Glucose, Bld: 103 mg/dL — ABNORMAL HIGH (ref 70–99)
Potassium: 3.6 mmol/L (ref 3.5–5.1)
Sodium: 141 mmol/L (ref 135–145)
Total Bilirubin: 0.3 mg/dL (ref 0.0–1.2)
Total Protein: 8 g/dL (ref 6.5–8.1)

## 2024-05-14 MED ORDER — HEPARIN SOD (PORK) LOCK FLUSH 100 UNIT/ML IV SOLN
INTRAVENOUS | Status: AC
  Filled 2024-05-14: qty 5

## 2024-05-14 MED ORDER — IOHEXOL 300 MG/ML  SOLN
100.0000 mL | Freq: Once | INTRAMUSCULAR | Status: AC | PRN
  Administered 2024-05-14: 100 mL via INTRAVENOUS

## 2024-05-18 ENCOUNTER — Other Ambulatory Visit: Payer: Self-pay

## 2024-05-18 ENCOUNTER — Inpatient Hospital Stay: Admitting: Licensed Clinical Social Worker

## 2024-05-18 ENCOUNTER — Inpatient Hospital Stay: Admitting: Hematology and Oncology

## 2024-05-18 VITALS — BP 152/79 | HR 106 | Temp 97.9°F | Resp 18 | Ht 64.0 in | Wt 174.8 lb

## 2024-05-18 DIAGNOSIS — C549 Malignant neoplasm of corpus uteri, unspecified: Secondary | ICD-10-CM | POA: Diagnosis not present

## 2024-05-18 DIAGNOSIS — C55 Malignant neoplasm of uterus, part unspecified: Secondary | ICD-10-CM | POA: Diagnosis not present

## 2024-05-18 DIAGNOSIS — Z609 Problem related to social environment, unspecified: Secondary | ICD-10-CM

## 2024-05-18 MED ORDER — LIDOCAINE-PRILOCAINE 2.5-2.5 % EX CREA: TOPICAL_CREAM | CUTANEOUS | 3 refills | Status: AC

## 2024-05-18 MED ORDER — DEXAMETHASONE 4 MG PO TABS: ORAL_TABLET | ORAL | 1 refills | Status: AC

## 2024-05-18 NOTE — Assessment & Plan Note (Addendum)
 The patient was diagnosed with advanced age uterine cancer with abnormal imaging in May 2025 and subsequently underwent surgery in July 2025 Pathology: High-grade sarcoma  CT imaging from July 2025 showed no evidence of residual disease She completed adjuvant chemotherapy in December 2025 Unfortunately, CT imaging from January 2026 showed cancer recurrence I recommend second line treatment with combination of gemcitabine and docetaxel Side effects were fully discussed with the patient and her son and she agreed to proceed

## 2024-05-18 NOTE — Assessment & Plan Note (Addendum)
 Unfortunately, with cancer relapse, she is not able to return back to work I will reach out to child psychotherapist to help the patient identify resources so she can keep her insurance

## 2024-05-18 NOTE — Progress Notes (Signed)
 [facility] OFFICE PROGRESS NOTE  Patient Care Team: Emilio Joesph [name] as PCP - General (Physician Assistant)  Assessment & Plan Sarcoma of uterus [facility]) The patient was diagnosed with advanced age uterine cancer with abnormal imaging in May 2025 and subsequently underwent surgery in July 2025 Pathology: High-grade sarcoma  CT imaging from July 2025 showed no evidence of residual disease She completed adjuvant chemotherapy in December 2025 Unfortunately, CT imaging from January 2026 showed cancer recurrence I recommend second line treatment with combination of gemcitabine and docetaxel Side effects were fully discussed with the patient and her son and she agreed to proceed Poor social situation Unfortunately, with cancer relapse, she is not able to return back to work I will reach out to child psychotherapist to help the patient identify resources so she can keep her insurance  Orders Placed This Encounter  Procedures   Consent Attestation for Oncology Treatment    The patient is informed of risks, benefits, side-effects of the prescribed oncology treatment. Potential short term and long term side effects and response rates discussed. After a long discussion, the patient made informed decision to proceed.:   Yes   CBC with Differential (Cancer Center Only)    Standing Status:   Future    Expected Date:   05/25/2024    Expiration Date:   05/25/2025   CMP (Cancer Center only)    Standing Status:   Future    Expected Date:   05/25/2024    Expiration Date:   05/25/2025   CBC with Differential (Cancer Center Only)    Standing Status:   Future    Expected Date:   06/01/2024    Expiration Date:   06/01/2025   CMP (Cancer Center only)    Standing Status:   Future    Expected Date:   06/01/2024    Expiration Date:   06/01/2025   CBC with Differential (Cancer Center Only)    Standing Status:   Future    Expected Date:   06/15/2024    Expiration Date:   06/15/2025   CMP (Cancer Center  only)    Standing Status:   Future    Expected Date:   06/15/2024    Expiration Date:   06/15/2025   CBC with Differential (Cancer Center Only)    Standing Status:   Future    Expected Date:   06/22/2024    Expiration Date:   06/22/2025   CMP (Cancer Center only)    Standing Status:   Future    Expected Date:   06/22/2024    Expiration Date:   06/22/2025   CBC with Differential (Cancer Center Only)    Standing Status:   Future    Expected Date:   07/06/2024    Expiration Date:   07/06/2025   CMP (Cancer Center only)    Standing Status:   Future    Expected Date:   07/06/2024    Expiration Date:   07/06/2025   CBC with Differential (Cancer Center Only)    Standing Status:   Future    Expected Date:   07/13/2024    Expiration Date:   07/13/2025   CMP (Cancer Center only)    Standing Status:   Future    Expected Date:   07/13/2024    Expiration Date:   07/13/2025   CBC with Differential (Cancer Center Only)    Standing Status:   Future    Expected Date:   08/03/2024  Expiration Date:   08/03/2025   CMP (Cancer Center only)    Standing Status:   Future    Expected Date:   08/03/2024    Expiration Date:   08/03/2025   CBC with Differential (Cancer Center Only)    Standing Status:   Future    Expected Date:   07/27/2024    Expiration Date:   07/27/2025   CMP (Cancer Center only)    Standing Status:   Future    Expected Date:   07/27/2024    Expiration Date:   07/27/2025     Almarie Bedford, MD  INTERVAL HISTORY: she returns for surveillance follow-up and review of test results She is here accompanied by her son She completed chemotherapy last month without major side effects I reviewed blood work and imaging study with the patient and her son which unfortunately show cancer relapse She is not symptomatic  PHYSICAL EXAMINATION: ECOG PERFORMANCE STATUS: 0 - Asymptomatic  Vitals:   05/18/24 1143  BP: (!) 152/79  Pulse: (!) 106  Resp: 18  Temp: 97.9 F (36.6 C)  SpO2: 100%   Filed Weights    05/18/24 1143  Weight: 174 lb 12.8 oz (79.3 kg)    Relevant data reviewed during this visit included CBC, CMP, CT imaging

## 2024-05-18 NOTE — Progress Notes (Signed)
 [facility] CSW Progress Note  Clinical Child Psychotherapist contacted patient by phone to follow-up on financial concerns. Pt is still out of work and her insurance will run out in March. Her husband works but is currently out of the country. She has not applied for Medicaid, SNAP or LIEAP yet.   Interventions: Referred patient to community resources: [employer]  Provided patient with information about how to apply for Medicaid, SNAP & LIEAP  Discussed [employer] for disability and [employer] [facility] will work on these applications/referrals on 05/24/24      Follow Up Plan:  CSW will see patient on 05/24/24 to work on further referrals and applications    Averill Pons E Othniel Maret, LCSW Clinical Social Worker [employer]

## 2024-05-19 ENCOUNTER — Other Ambulatory Visit: Payer: Self-pay

## 2024-05-23 NOTE — Progress Notes (Signed)
 Pharmacist Chemotherapy Monitoring - Initial Assessment    Anticipated start date: 05/24/24   The following has been reviewed per standard work regarding the patient's treatment regimen: The patient's diagnosis, treatment plan and drug doses, and organ/hematologic function Lab orders and baseline tests specific to treatment regimen  The treatment plan start date, drug sequencing, and pre-medications Prior authorization status  Patient's documented medication list, including drug-drug interaction screen and prescriptions for anti-emetics and supportive care specific to the treatment regimen The drug concentrations, fluid compatibility, administration routes, and timing of the medications to be used The patient's access for treatment and lifetime cumulative dose history, if applicable  The patient's medication allergies and previous infusion related reactions, if applicable   Changes made to treatment plan:  Fulphila changed to Udenyca per insurance preference/treatment authorization  Follow up needed:  N/A  Kristin [name], [facility] 05/23/2024  2:33 PM

## 2024-05-24 ENCOUNTER — Inpatient Hospital Stay: Admitting: Licensed Clinical Social Worker

## 2024-05-24 ENCOUNTER — Inpatient Hospital Stay

## 2024-05-24 VITALS — BP 136/68 | HR 105 | Temp 98.3°F | Resp 14

## 2024-05-24 DIAGNOSIS — C55 Malignant neoplasm of uterus, part unspecified: Secondary | ICD-10-CM | POA: Diagnosis not present

## 2024-05-24 DIAGNOSIS — C549 Malignant neoplasm of corpus uteri, unspecified: Secondary | ICD-10-CM

## 2024-05-24 LAB — CMP (CANCER CENTER ONLY)
ALT: 22 U/L (ref 0–44)
AST: 25 U/L (ref 15–41)
Albumin: 4.2 g/dL (ref 3.5–5.0)
Alkaline Phosphatase: 124 U/L (ref 38–126)
Anion gap: 12 (ref 5–15)
BUN: 12 mg/dL (ref 6–20)
CO2: 26 mmol/L (ref 22–32)
Calcium: 9.7 mg/dL (ref 8.9–10.3)
Chloride: 101 mmol/L (ref 98–111)
Creatinine: 0.76 mg/dL (ref 0.44–1.00)
GFR, Estimated: >60 mL/min
Glucose, Bld: 124 mg/dL — ABNORMAL HIGH (ref 70–99)
Potassium: 3.8 mmol/L (ref 3.5–5.1)
Sodium: 139 mmol/L (ref 135–145)
Total Bilirubin: 0.3 mg/dL (ref 0.0–1.2)
Total Protein: 7.8 g/dL (ref 6.5–8.1)

## 2024-05-24 LAB — CBC WITH DIFFERENTIAL (CANCER CENTER ONLY)
Abs Immature Granulocytes: 0.02 K/uL (ref 0.00–0.07)
Basophils Absolute: 0 K/uL (ref 0.0–0.1)
Basophils Relative: 1 %
Eosinophils Absolute: 0.1 K/uL (ref 0.0–0.5)
Eosinophils Relative: 2 %
HCT: 39.1 % (ref 36.0–46.0)
Hemoglobin: 13.1 g/dL (ref 12.0–15.0)
Immature Granulocytes: 0 %
Lymphocytes Relative: 28 %
Lymphs Abs: 2.5 K/uL (ref 0.7–4.0)
MCH: 30.1 pg (ref 26.0–34.0)
MCHC: 33.5 g/dL (ref 30.0–36.0)
MCV: 89.9 fL (ref 80.0–100.0)
Monocytes Absolute: 0.8 K/uL (ref 0.1–1.0)
Monocytes Relative: 9 %
Neutro Abs: 5.3 K/uL (ref 1.7–7.7)
Neutrophils Relative %: 60 %
Platelet Count: 327 K/uL (ref 150–400)
RBC: 4.35 MIL/uL (ref 3.87–5.11)
RDW: 14.7 % (ref 11.5–15.5)
WBC Count: 8.7 K/uL (ref 4.0–10.5)
nRBC: 0 % (ref 0.0–0.2)

## 2024-05-24 MED ORDER — SODIUM CHLORIDE 0.9 % IV SOLN
900.0000 mg/m2 | Freq: Once | INTRAVENOUS | Status: AC
  Administered 2024-05-24: 1710 mg via INTRAVENOUS
  Filled 2024-05-24: qty 44.97

## 2024-05-24 MED ORDER — ALTEPLASE 2 MG IJ SOLR
2.0000 mg | Freq: Once | INTRAMUSCULAR | Status: AC | PRN
  Administered 2024-05-24: 2 mg
  Filled 2024-05-24: qty 2

## 2024-05-24 MED ORDER — SODIUM CHLORIDE 0.9 % IV SOLN: INTRAVENOUS | Status: DC

## 2024-05-24 MED ORDER — PROCHLORPERAZINE MALEATE 10 MG PO TABS
10.0000 mg | ORAL_TABLET | Freq: Once | ORAL | Status: AC
  Administered 2024-05-24: 10 mg via ORAL
  Filled 2024-05-24: qty 1

## 2024-05-24 NOTE — Progress Notes (Signed)
 [facility] CSW Progress Note  Visual Merchandiser met with patient to follow-up on financial concerns.    Interventions: Referred patient to community resources: [employer] for disability and Banker for [facility] for financial assistance  Reminded pt of how to apply for Medicaid & SNAP  Patient has also been in contact with [employer] regarding assistance.      Follow Up Plan:  Patient will contact CSW with any support or resource needs    Mikalia Fessel E Xiomar Crompton, LCSW Clinical Social Worker [facility]

## 2024-05-24 NOTE — Patient Instructions (Addendum)
 Instructions from Dr. Lonn: take generic Claritin 10 mg in the morning for 7 days, starting Feb. 2 (the day of your injection).  [facility] - A DEPT OF [facility]  Discharge Instructions: Thank you for choosing [facility] to provide your oncology and hematology care.   If you have a lab appointment with the Cancer Center, please go directly to the Cancer Center and check in at the registration area.   Wear comfortable clothing and clothing appropriate for easy access to any Portacath or PICC line.   We strive to give you quality time with your provider. You may need to reschedule your appointment if you arrive late (15 or more minutes).  Arriving late affects you and other patients whose appointments are after yours.  Also, if you miss three or more appointments without notifying the office, you may be dismissed from the clinic at the providers discretion.      For prescription refill requests, have your pharmacy contact our office and allow 72 hours for refills to be completed.    Today you received the following chemotherapy and/or immunotherapy agents: Gemcitabine       To help prevent nausea and vomiting after your treatment, we encourage you to take your nausea medication as directed.  BELOW ARE SYMPTOMS THAT SHOULD BE REPORTED IMMEDIATELY: *FEVER GREATER THAN 100.4 F (38 C) OR HIGHER *CHILLS OR SWEATING *NAUSEA AND VOMITING THAT IS NOT CONTROLLED WITH YOUR NAUSEA MEDICATION *UNUSUAL SHORTNESS OF BREATH *UNUSUAL BRUISING OR BLEEDING *URINARY PROBLEMS (pain or burning when urinating, or frequent urination) *BOWEL PROBLEMS (unusual diarrhea, constipation, pain near the anus) TENDERNESS IN MOUTH AND THROAT WITH OR WITHOUT PRESENCE OF ULCERS (sore throat, sores in mouth, or a toothache) UNUSUAL RASH, SWELLING OR PAIN  UNUSUAL VAGINAL DISCHARGE OR ITCHING   Items with * indicate a potential emergency and should be followed up as soon  as possible or go to the Emergency Department if any problems should occur.  Please show the CHEMOTHERAPY ALERT CARD or IMMUNOTHERAPY ALERT CARD at check-in to the Emergency Department and triage nurse.  Should you have questions after your visit or need to cancel or reschedule your appointment, please contact [facility] - A DEPT OF [facility]  Dept: [phone]  and follow the prompts.  Office hours are 8:00 a.m. to 4:30 p.m. Monday - Friday. Please note that voicemails left after 4:00 p.m. may not be returned until the following business day.  We are closed weekends and major holidays. You have access to a nurse at all times for urgent questions. Please call the main number to the clinic Dept: [phone] and follow the prompts.   For any non-urgent questions, you may also contact your provider using MyChart. We now offer e-Visits for anyone 40 and older to request care online for non-urgent symptoms. For details visit [email or web address].   Also download the MyChart app! Go to the app store, search MyChart, open the app, select [facility], and log in with your MyChart username and password.  Gemcitabine  Injection What is this medication? GEMCITABINE  (jem SYE ta been) treats some types of cancer. It works by slowing down the growth of cancer cells. This medicine may be used for other purposes; ask your health care provider or pharmacist if you have questions. COMMON BRAND NAME(S): Gemzar , Infugem  What should I tell my care team before I take this medication? They need to know if you  have any of these conditions: Blood disorders Infection Kidney disease Liver disease Lung or breathing disease, such as asthma or COPD Recent or ongoing radiation therapy An unusual or allergic reaction to gemcitabine , other medications, foods, dyes, or preservatives If you or your partner are pregnant or trying to get pregnant Breast-feeding How should I use this  medication? This medication is injected into a vein. It is given by your care team in a hospital or clinic setting. Talk to your care team about the use of this medication in children. Special care may be needed. Overdosage: If you think you have taken too much of this medicine contact a poison control center or emergency room at once. NOTE: This medicine is only for you. Do not share this medicine with others. What if I miss a dose? Keep appointments for follow-up doses. It is important not to miss your dose. Call your care team if you are unable to keep an appointment. What may interact with this medication? Interactions have not been studied. This list may not describe all possible interactions. Give your health care provider a list of all the medicines, herbs, non-prescription drugs, or dietary supplements you use. Also tell them if you smoke, drink alcohol , or use illegal drugs. Some items may interact with your medicine. What should I watch for while using this medication? Your condition will be monitored carefully while you are receiving this medication. This medication may make you feel generally unwell. This is not uncommon, as chemotherapy can affect healthy cells as well as cancer cells. Report any side effects. Continue your course of treatment even though you feel ill unless your care team tells you to stop. In some cases, you may be given additional medications to help with side effects. Follow all directions for their use. This medication may increase your risk of getting an infection. Call your care team for advice if you get a fever, chills, sore throat, or other symptoms of a cold or flu. Do not treat yourself. Try to avoid being around people who are sick. This medication may increase your risk to bruise or bleed. Call your care team if you notice any unusual bleeding. Be careful brushing or flossing your teeth or using a toothpick because you may get an infection or bleed more  easily. If you have any dental work done, tell your dentist you are receiving this medication. Avoid taking medications that contain aspirin, acetaminophen , ibuprofen , naproxen, or ketoprofen unless instructed by your care team. These medications may hide a fever. Talk to your care team if you or your partner wish to become pregnant or think you might be pregnant. This medication can cause serious birth defects if taken during pregnancy and for 6 months after the last dose. A negative pregnancy test is required before starting this medication. A reliable form of contraception is recommended while taking this medication and for 6 months after the last dose. Talk to your care team about effective forms of contraception. Do not father a child while taking this medication and for 3 months after the last dose. Use a condom while having sex during this time period. Do not breastfeed while taking this medication and for at least 1 week after the last dose. This medication may cause infertility. Talk to your care team if you are concerned about your fertility. What side effects may I notice from receiving this medication? Side effects that you should report to your care team as soon as possible: Allergic reactions--skin rash, itching, hives,  swelling of the face, lips, tongue, or throat Capillary leak syndrome--stomach or muscle pain, unusual weakness or fatigue, feeling faint or lightheaded, decrease in the amount of urine, swelling of the ankles, hands, or feet, trouble breathing Infection--fever, chills, cough, sore throat, wounds that don't heal, pain or trouble when passing urine, general feeling of discomfort or being unwell Liver injury--right upper belly pain, loss of appetite, nausea, light-colored stool, dark yellow or brown urine, yellowing skin or eyes, unusual weakness or fatigue Low red blood cell level--unusual weakness or fatigue, dizziness, headache, trouble breathing Lung injury--shortness of  breath or trouble breathing, cough, spitting up blood, chest pain, fever Stomach pain, bloody diarrhea, pale skin, unusual weakness or fatigue, decrease in the amount of urine, which may be signs of hemolytic uremic syndrome Sudden and severe headache, confusion, change in vision, seizures, which may be signs of posterior reversible encephalopathy syndrome (PRES) Unusual bruising or bleeding Side effects that usually do not require medical attention (report to your care team if they continue or are bothersome): Diarrhea Drowsiness Hair loss Nausea Pain, redness, or swelling with sores inside the mouth or throat Vomiting This list may not describe all possible side effects. Call your doctor for medical advice about side effects. You may report side effects to FDA at 1-800-FDA-1088. Where should I keep my medication? This medication is given in a hospital or clinic. It will not be stored at home. NOTE: This sheet is a summary. It may not cover all possible information. If you have questions about this medicine, talk to your doctor, pharmacist, or health care provider.  2024 Elsevier/Gold Standard (2021-08-25 00:00:00)

## 2024-05-25 ENCOUNTER — Telehealth: Payer: Self-pay

## 2024-05-25 NOTE — Telephone Encounter (Signed)
 Dr. Lonn - first time Gemzar  f/u call - pt tolerated well Received: Nilsa Metro Katrinka DELENA, RN  P Onc Triage Nurse [facility] Caller: Unspecified (Yesterday,  5:18 PM)

## 2024-06-01 ENCOUNTER — Telehealth: Payer: Self-pay | Admitting: Hematology and Oncology

## 2024-06-01 ENCOUNTER — Encounter: Payer: Self-pay | Admitting: Hematology and Oncology

## 2024-06-01 ENCOUNTER — Inpatient Hospital Stay: Admitting: Hematology and Oncology

## 2024-06-01 ENCOUNTER — Inpatient Hospital Stay

## 2024-06-01 VITALS — BP 124/62 | HR 85 | Temp 98.7°F | Resp 16 | Wt 172.5 lb

## 2024-06-01 DIAGNOSIS — C55 Malignant neoplasm of uterus, part unspecified: Secondary | ICD-10-CM | POA: Diagnosis not present

## 2024-06-01 DIAGNOSIS — T148XXA Other injury of unspecified body region, initial encounter: Secondary | ICD-10-CM | POA: Insufficient documentation

## 2024-06-01 DIAGNOSIS — C549 Malignant neoplasm of corpus uteri, unspecified: Secondary | ICD-10-CM | POA: Diagnosis not present

## 2024-06-01 LAB — CBC WITH DIFFERENTIAL (CANCER CENTER ONLY)
Abs Immature Granulocytes: 0.02 10*3/uL (ref 0.00–0.07)
Basophils Absolute: 0 10*3/uL (ref 0.0–0.1)
Basophils Relative: 0 %
Eosinophils Absolute: 0 10*3/uL (ref 0.0–0.5)
Eosinophils Relative: 1 %
HCT: 36.9 % (ref 36.0–46.0)
Hemoglobin: 12.3 g/dL (ref 12.0–15.0)
Immature Granulocytes: 0 %
Lymphocytes Relative: 33 %
Lymphs Abs: 2.1 10*3/uL (ref 0.7–4.0)
MCH: 29.8 pg (ref 26.0–34.0)
MCHC: 33.3 g/dL (ref 30.0–36.0)
MCV: 89.3 fL (ref 80.0–100.0)
Monocytes Absolute: 0.5 10*3/uL (ref 0.1–1.0)
Monocytes Relative: 8 %
Neutro Abs: 3.8 10*3/uL (ref 1.7–7.7)
Neutrophils Relative %: 58 %
Platelet Count: 182 10*3/uL (ref 150–400)
RBC: 4.13 MIL/uL (ref 3.87–5.11)
RDW: 14.5 % (ref 11.5–15.5)
WBC Count: 6.4 10*3/uL (ref 4.0–10.5)
nRBC: 0 % (ref 0.0–0.2)

## 2024-06-01 LAB — CMP (CANCER CENTER ONLY)
ALT: 25 U/L (ref 0–44)
AST: 22 U/L (ref 15–41)
Albumin: 4 g/dL (ref 3.5–5.0)
Alkaline Phosphatase: 129 U/L — ABNORMAL HIGH (ref 38–126)
Anion gap: 12 (ref 5–15)
BUN: 15 mg/dL (ref 6–20)
CO2: 26 mmol/L (ref 22–32)
Calcium: 10 mg/dL (ref 8.9–10.3)
Chloride: 102 mmol/L (ref 98–111)
Creatinine: 0.7 mg/dL (ref 0.44–1.00)
GFR, Estimated: >60 mL/min
Glucose, Bld: 107 mg/dL — ABNORMAL HIGH (ref 70–99)
Potassium: 3.5 mmol/L (ref 3.5–5.1)
Sodium: 140 mmol/L (ref 135–145)
Total Bilirubin: 0.3 mg/dL (ref 0.0–1.2)
Total Protein: 7.9 g/dL (ref 6.5–8.1)

## 2024-06-01 MED ORDER — SODIUM CHLORIDE 0.9 % IV SOLN
900.0000 mg/m2 | Freq: Once | INTRAVENOUS | Status: AC
  Administered 2024-06-01: 1710 mg via INTRAVENOUS
  Filled 2024-06-01: qty 44.97

## 2024-06-01 MED ORDER — SODIUM CHLORIDE 0.9 % IV SOLN
75.0000 mg/m2 | Freq: Once | INTRAVENOUS | Status: AC
  Administered 2024-06-01: 142 mg via INTRAVENOUS
  Filled 2024-06-01: qty 14.2

## 2024-06-01 MED ORDER — DEXAMETHASONE SOD PHOSPHATE PF 10 MG/ML IJ SOLN
10.0000 mg | Freq: Once | INTRAMUSCULAR | Status: AC
  Administered 2024-06-01: 10 mg via INTRAVENOUS
  Filled 2024-06-01: qty 1

## 2024-06-01 MED ORDER — SODIUM CHLORIDE 0.9% FLUSH: 10.0000 mL | INTRAVENOUS | Status: DC | PRN

## 2024-06-01 MED ORDER — SODIUM CHLORIDE 0.9 % IV SOLN: INTRAVENOUS | Status: DC

## 2024-06-01 NOTE — Progress Notes (Signed)
 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 with abnormal imaging in May 2025 and subsequently underwent surgery in July 2025 Pathology: High-grade sarcoma  CT imaging from July 2025 showed no evidence of residual disease She completed adjuvant chemotherapy in December 2025 Unfortunately, CT imaging from January 2026 showed cancer recurrence She is now receiving second line treatment with combination of gemcitabine  and docetaxel  She tolerated recent treatment well except for some minor bruising We will proceed with treatment without delay Plan to repeat imaging study after cycle 3 of treatment Bruising She has bruising on her right shin This is not due to recent treatment She is reassured and we discussed conservative approach  No orders of the defined types were placed in this encounter.    Almarie Bedford, MD  INTERVAL HISTORY: she returns for treatment follow-up Complications related to previous cycle of chemotherapy included mild skin bruising on her right shin  PHYSICAL EXAMINATION: ECOG PERFORMANCE STATUS: 0 - Asymptomatic  No results found for: [id number]    Latest Ref Rng & Units 06/01/2024    7:50 AM 05/24/2024    1:08 PM 05/14/2024   11:28 AM  CBC  WBC 4.0 - 10.5 K/uL 6.4  8.7  6.0   Hemoglobin 12.0 - 15.0 g/dL 87.6  86.8  86.6   Hematocrit 36.0 - 46.0 % 36.9  39.1  41.0   Platelets 150 - 400 K/uL 182  327  324       Chemistry      Component Value Date/Time   NA 140 06/01/2024 0750   K 3.5 06/01/2024 0750   CL 102 06/01/2024 0750   CO2 26 06/01/2024 0750   BUN 15 06/01/2024 0750   CREATININE 0.70 06/01/2024 0750      Component Value Date/Time   CALCIUM  10.0 06/01/2024 0750   ALKPHOS 129 (H) 06/01/2024 0750   AST 22 06/01/2024 0750   ALT 25 06/01/2024 0750   BILITOT 0.3  06/01/2024 0750     Brief examination of her right leg reveals some bruising on her shin area Other relevant data reviewed during this visit included CBC and CMP

## 2024-06-01 NOTE — Assessment & Plan Note (Addendum)
 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

## 2024-06-01 NOTE — Assessment & Plan Note (Addendum)
 She has bruising on her right shin This is not due to recent treatment She is reassured and we discussed conservative approach

## 2024-06-04 ENCOUNTER — Inpatient Hospital Stay

## 2024-06-04 ENCOUNTER — Inpatient Hospital Stay: Attending: Gynecologic Oncology

## 2024-06-05 ENCOUNTER — Inpatient Hospital Stay: Attending: Gynecologic Oncology

## 2024-06-05 ENCOUNTER — Ambulatory Visit: Payer: Self-pay | Admitting: *Deleted

## 2024-06-05 VITALS — BP 103/85 | HR 121 | Temp 99.1°F | Resp 18

## 2024-06-05 DIAGNOSIS — C549 Malignant neoplasm of corpus uteri, unspecified: Secondary | ICD-10-CM

## 2024-06-05 MED ORDER — PEGFILGRASTIM-CBQV 6 MG/0.6ML ~~LOC~~ SOSY
6.0000 mg | PREFILLED_SYRINGE | Freq: Once | SUBCUTANEOUS | Status: AC
  Administered 2024-06-05: 6 mg via SUBCUTANEOUS
  Filled 2024-06-05: qty 0.6

## 2024-06-05 NOTE — Progress Notes (Signed)
 Patient here for Udenyca  injection.  Heart rate 121.  Denies chest pain, headaches and dizziness.  Secure chatted Dr. Lonn.  Ok to proceed per Dr. Lonn if patient has no symptoms.  Patient made aware.  Injection given.

## 2024-06-05 NOTE — Patient Instructions (Signed)

## 2024-06-07 ENCOUNTER — Telehealth: Payer: Self-pay

## 2024-06-07 NOTE — Telephone Encounter (Signed)
 Returned her call. She is complaining of rash to her back. Left a message asking her to call the office back.

## 2024-06-07 NOTE — Telephone Encounter (Signed)
 Let me know when images are uploaded

## 2024-06-07 NOTE — Telephone Encounter (Signed)
 Returned her call. She is complaining of rash to upper back that is itchy for 2 days. 2 days ago she noticed that she has discolored area to both hips that is the size of her palm, denies itching and fall.  Instructed to apply OTC hydrocortisone to itchy spots. She will take pictures and send to the office on mychart.

## 2024-06-08 ENCOUNTER — Ambulatory Visit
Admission: EM | Admit: 2024-06-08 | Discharge: 2024-06-08 | Disposition: A | Discharge status: Home / Self Care | Source: Home / Self Care

## 2024-06-08 ENCOUNTER — Telehealth: Payer: Self-pay

## 2024-06-08 ENCOUNTER — Other Ambulatory Visit: Payer: Self-pay | Admitting: Hematology and Oncology

## 2024-06-08 ENCOUNTER — Other Ambulatory Visit: Payer: Self-pay

## 2024-06-08 ENCOUNTER — Encounter: Payer: Self-pay | Admitting: Emergency Medicine

## 2024-06-08 DIAGNOSIS — K5730 Diverticulosis of large intestine without perforation or abscess without bleeding: Secondary | ICD-10-CM | POA: Insufficient documentation

## 2024-06-08 DIAGNOSIS — K7689 Other specified diseases of liver: Secondary | ICD-10-CM | POA: Insufficient documentation

## 2024-06-08 DIAGNOSIS — R1031 Right lower quadrant pain: Secondary | ICD-10-CM | POA: Insufficient documentation

## 2024-06-08 DIAGNOSIS — E039 Hypothyroidism, unspecified: Secondary | ICD-10-CM

## 2024-06-08 DIAGNOSIS — R1032 Left lower quadrant pain: Secondary | ICD-10-CM | POA: Insufficient documentation

## 2024-06-08 DIAGNOSIS — Z1211 Encounter for screening for malignant neoplasm of colon: Secondary | ICD-10-CM | POA: Insufficient documentation

## 2024-06-08 DIAGNOSIS — D3A026 Benign carcinoid tumor of the rectum: Secondary | ICD-10-CM | POA: Insufficient documentation

## 2024-06-08 DIAGNOSIS — R509 Fever, unspecified: Secondary | ICD-10-CM

## 2024-06-08 LAB — POC COVID19/FLU A&B COMBO
Covid Antigen, POC: NEGATIVE
Influenza A Antigen, POC: NEGATIVE
Influenza B Antigen, POC: NEGATIVE

## 2024-06-08 MED ORDER — TRAMADOL HCL 50 MG PO TABS: 50.0000 mg | ORAL_TABLET | Freq: Four times a day (QID) | ORAL | 0 refills | Status: AC | PRN

## 2024-06-08 NOTE — Telephone Encounter (Signed)
 Returned her call. She is complaining of pain from hips down her legs. She will take tylenol  prn and continue Claritin. Rash to upper back is better after using hydrocortisone cream today. She is going send a picture of discolored areas to hips thru mychart today.

## 2024-06-08 NOTE — ED Provider Notes (Incomplete)
 " [facility]    CSN: [id number] Arrival date & time: 06/08/24  1635      History   Chief Complaint Chief Complaint  Patient presents with   Fever    HPI [name] is a 59 y.o. female.    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   Encounter for screening for malignant neoplasm of colon 06/08/2024   Hepatic cyst 06/08/2024   Left lower quadrant pain 06/08/2024   Right lower quadrant pain 06/08/2024   Bruising 06/01/2024   Drug-induced neutropenia 03/22/2024   Thrombocytopenia 03/01/2024   Poor social situation 03/01/2024   Physical deconditioning 01/06/2024   Other constipation 11/25/2023   Sarcoma of uterus (HCC) 11/02/2023   Pulmonary nodules 10/13/2023   Iron deficiency anemia 10/13/2023   Abnormal weight loss 10/13/2023   Tachycardia 10/13/2023   Uterine leiomyoma 08/06/2023   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   Tinnitus aurium, 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   Mixed hyperlipidemia 07/10/2021   Arthritis 05/29/2019    Past Surgical History:  Procedure Laterality Date   COLONOSCOPY     DEBULKING N/A 11/02/2023   Procedure: DEBULKING, ABDOMINAL;  Surgeon: Viktoria Comer SAUNDERS, MD;  Location: [facility];  Service: Gynecology;  Laterality: N/A;  POSSIBLE DEBULKING   DILATATION & CURETTAGE/HYSTEROSCOPY WITH MYOSURE N/A 04/29/2016   Procedure: DILATATION & CURETTAGE/HYSTEROSCOPY WITH MYOSURE;  Surgeon: Dickie Carder, MD;  Location: [facility];  Service: Gynecology;  Laterality: N/A;   IR IMAGING GUIDED PORT INSERTION  12/02/2023   LAPAROSCOPY N/A 11/02/2023   Procedure: LAPAROSCOPY,  DIAGNOSTIC;  Surgeon: Viktoria Comer SAUNDERS, MD;  Location: [facility];  Service: Gynecology;  Laterality: N/A;   LAPAROTOMY N/A 11/02/2023   Procedure: MINI LAPAROTOMY;  Surgeon: Viktoria Comer SAUNDERS, MD;  Location: [facility];  Service: Gynecology;  Laterality: N/A;   ROBOTIC ASSISTED TOTAL HYSTERECTOMY WITH BILATERAL SALPINGO OOPHERECTOMY N/A 11/02/2023   Procedure: HYSTERECTOMY, TOTAL, ROBOT-ASSISTED, LAPAROSCOPIC, WITH BILATERAL SALPINGO-OOPHORECTOMY;  Surgeon: Viktoria Comer SAUNDERS, MD;  Location: [facility];  Service: Gynecology;  Laterality: N/A;   TUBAL LIGATION     UPPER GI ENDOSCOPY      OB History     Gravida  4   Para  4   Term      Preterm      AB      Living         SAB      IAB      Ectopic      Multiple      Live Births               Home Medications    Prior to Admission medications  Medication Sig Start Date End Date Taking? Authorizing Provider  acetaminophen  (TYLENOL ) 500 MG tablet Take 500 mg by mouth every 6 (six) hours as needed.   Yes [provider]  loratadine (CLARITIN) 10 MG tablet Take 10 mg by mouth daily.   Yes [provider]  Na Sulfate-K Sulfate-Mg Sulfate concentrate (SUPREP BOWEL PREP KIT) 17.5-3.13-1.6 GM/177ML SOLN 177  mLs. 09/28/23  Yes [provider]  albuterol  (PROVENTIL ) (2.5 MG/3ML) 0.083% nebulizer solution Take 2.5 mg by nebulization every 6 (six) hours as needed for wheezing.    [provider]  albuterol  (VENTOLIN  HFA) 108 (90 Base) MCG/ACT inhaler Inhale 1-2 puffs into the lungs every 6 (six) hours as needed for wheezing or shortness of breath. 09/21/21   Stuart Vernell Norris, PA-C  atorvastatin  (LIPITOR) 20 MG tablet Take 20 mg by mouth at bedtime.    [provider]  dexamethasone  (DECADRON ) 4 MG tablet Take 2 tabs by mouth starting the day before chemo. Then take 2 tabs daily for 2 days starting day after chemo. Take with food. 05/18/24   Lonn Hicks, MD  lidocaine -prilocaine  (EMLA ) cream  Apply to affected area once 12/08/23   Lonn Hicks, MD  lidocaine -prilocaine  (EMLA ) cream Apply to affected area once 05/18/24   Lonn Hicks, MD  Lifitegrast  (XIIDRA ) 5 % SOLN Place 1 drop into both eyes in the morning.    [provider]  Multiple Vitamin (MULTIVITAMIN WITH MINERALS) TABS tablet Take 1 tablet by mouth daily.    [provider]  ondansetron  (ZOFRAN ) 8 MG tablet Take 1 tablet (8 mg total) by mouth every 8 (eight) hours as needed for nausea or vomiting. Start on the third day after chemotherapy. 12/08/23   Lonn Hicks, MD  Polyethyl Glycol-Propyl Glycol (SYSTANE OP) Place 1 drop into both eyes 3 (three) times daily as needed (dry/irritated eyes.).    [provider]  prochlorperazine  (COMPAZINE ) 10 MG tablet Take 1 tablet (10 mg total) by mouth every 6 (six) hours as needed for nausea or vomiting. 12/08/23   Lonn Hicks, MD  traMADol  (ULTRAM ) 50 MG tablet Take 1 tablet (50 mg total) by mouth every 6 (six) hours as needed. 06/08/24   Lonn Hicks, MD    Family History Family History  Problem Relation Age of Onset   Breast cancer Neg Hx    Colon cancer Neg Hx    Pancreatic cancer Neg Hx    Prostate cancer Neg Hx    Ovarian cancer Neg Hx    Endometrial cancer Neg Hx     Social History Social History[1]   Allergies   Patient has no known allergies.   Review of Systems Review of Systems  Constitutional:  Positive for fever.     Physical Exam Triage Vital Signs ED Triage Vitals  Encounter Vitals Group     BP 06/08/24 1816 108/67     Girls Systolic BP Percentile --      Girls Diastolic BP Percentile --      Boys Systolic BP Percentile --      Boys Diastolic BP Percentile --      Pulse Rate 06/08/24 1816 (!) 102     Resp 06/08/24 1816 19     Temp 06/08/24 1816 99.4 F (37.4 C)     Temp Source 06/08/24 1816 Oral     SpO2 06/08/24 1816 97 %     Weight 06/08/24 1816 172 lb 6.4 oz (78.2 kg)     Height --      Head Circumference --      Peak  Flow --      Pain Score 06/08/24 1814 0     Pain Loc --      Pain Education --      Exclude from Growth Chart --    No data found.  Updated Vital Signs BP 108/67 (BP Location: Left Arm)  Pulse (!) 102   Temp 99.4 F (37.4 C) (Oral)   Resp 19   Wt 172 lb 6.4 oz (78.2 kg)   SpO2 97%   BMI 29.59 kg/m   Visual Acuity Right Eye Distance:   Left Eye Distance:   Bilateral Distance:    Right Eye Near:   Left Eye Near:    Bilateral Near:     Physical Exam   UC Treatments / Results  Labs (all labs ordered are listed, but only abnormal results are displayed) Labs Reviewed  POC COVID19/FLU A&B COMBO - Normal    EKG   Radiology No results found.  Procedures Procedures (including critical care time)  Medications Ordered in UC Medications - No data to display  Initial Impression / Assessment and Plan / UC Course  I have reviewed the triage vital signs and the nursing notes.  Pertinent labs & imaging results that were available during my care of the patient were reviewed by me and considered in my medical decision making (see chart for details).     *** Final Clinical Impressions(s) / UC Diagnoses   Final diagnoses:  Fever, unspecified     Discharge Instructions      Fever and general discomfort are common reactions after white blood cell infusion. We should manage these symptoms with Tylenol  in addition to Claritin.    ED Prescriptions   None    PDMP not reviewed this encounter.    [1]  Social History Tobacco Use   Smoking status: Never    Passive exposure: Never   Smokeless tobacco: Never  Vaping Use   Vaping status: Never Used  Substance Use Topics   Alcohol  use: No   Drug use: No   "

## 2024-06-08 NOTE — Telephone Encounter (Signed)
 Spoke with the patient explained what Dr Lonn states about her bone pain.Informed that pain medication was called in and we discussed how to take it. Patient was able to repeat back instructions.

## 2024-06-08 NOTE — Discharge Instructions (Signed)
 Fever and general discomfort are common reactions after white blood cell infusion. We should manage these symptoms with Tylenol  in addition to Claritin.

## 2024-06-08 NOTE — ED Triage Notes (Signed)
 Pt presents c/o fever x today. Pt states,  I am on chemo and I had a white blood cell injection on Tuesday. At 4 pm I checked my temp and it was 101 so my dr told me to come to Urgent Care to make sure everything is okay.

## 2024-06-08 NOTE — Telephone Encounter (Signed)
 Returned her call. She is complaining temperature 101 this afternoon. No other symptoms. Instructed to go to urgent care or ER now to be evaluation.  She verbalized understanding and will go to the urgent care and call the office back Monday with update.

## 2024-06-08 NOTE — Telephone Encounter (Signed)
 Her bone aches is from the injection I will send small amount of tramadol  for her to take as needed in case the tylenol  is not enough, she can combine tylenol  with tramadol  I have not seen any pictures yet

## 2024-06-12 ENCOUNTER — Ambulatory Visit: Admitting: Acute Care

## 2024-06-15 ENCOUNTER — Inpatient Hospital Stay

## 2024-06-15 ENCOUNTER — Inpatient Hospital Stay: Admitting: Hematology and Oncology

## 2024-06-22 ENCOUNTER — Inpatient Hospital Stay

## 2024-06-22 ENCOUNTER — Inpatient Hospital Stay: Admitting: Hematology and Oncology

## 2024-06-25 ENCOUNTER — Inpatient Hospital Stay

## 2024-09-14 ENCOUNTER — Inpatient Hospital Stay: Admitting: Gynecologic Oncology
